# Patient Record
Sex: Female | Born: 1955 | Race: White | Hispanic: No | Smoking: Current every day smoker
Health system: Southern US, Community
[De-identification: ages and names within clinical notes are randomized; demographics above are authoritative.]

## PROBLEM LIST (undated history)

## (undated) DIAGNOSIS — E119 Type 2 diabetes mellitus without complications: Secondary | ICD-10-CM

## (undated) DIAGNOSIS — I509 Heart failure, unspecified: Secondary | ICD-10-CM

## (undated) DIAGNOSIS — Z95810 Presence of automatic (implantable) cardiac defibrillator: Secondary | ICD-10-CM

## (undated) DIAGNOSIS — J449 Chronic obstructive pulmonary disease, unspecified: Secondary | ICD-10-CM

## (undated) DIAGNOSIS — E039 Hypothyroidism, unspecified: Secondary | ICD-10-CM

## (undated) HISTORY — PX: PACEMAKER PLACEMENT: SHX43

## (undated) HISTORY — PX: CARDIAC DEFIBRILLATOR PLACEMENT: SHX171

---

## 2005-06-09 ENCOUNTER — Ambulatory Visit: Payer: Self-pay | Admitting: Internal Medicine

## 2018-11-21 ENCOUNTER — Emergency Department (HOSPITAL_COMMUNITY): Payer: Medicaid Other

## 2018-11-21 ENCOUNTER — Encounter (HOSPITAL_COMMUNITY): Payer: Self-pay | Admitting: Internal Medicine

## 2018-11-21 ENCOUNTER — Inpatient Hospital Stay (HOSPITAL_COMMUNITY)
Admission: EM | Admit: 2018-11-21 | Discharge: 2018-11-29 | DRG: 640 | Disposition: A | Discharge status: Home Health | Payer: Medicaid Other | Attending: Family Medicine | Admitting: Family Medicine

## 2018-11-21 DIAGNOSIS — E512 Wernicke's encephalopathy: Principal | ICD-10-CM | POA: Diagnosis present

## 2018-11-21 DIAGNOSIS — Z881 Allergy status to other antibiotic agents status: Secondary | ICD-10-CM

## 2018-11-21 DIAGNOSIS — R443 Hallucinations, unspecified: Secondary | ICD-10-CM | POA: Diagnosis present

## 2018-11-21 DIAGNOSIS — Z794 Long term (current) use of insulin: Secondary | ICD-10-CM

## 2018-11-21 DIAGNOSIS — E039 Hypothyroidism, unspecified: Secondary | ICD-10-CM | POA: Diagnosis not present

## 2018-11-21 DIAGNOSIS — R4182 Altered mental status, unspecified: Secondary | ICD-10-CM | POA: Diagnosis not present

## 2018-11-21 DIAGNOSIS — Z20828 Contact with and (suspected) exposure to other viral communicable diseases: Secondary | ICD-10-CM | POA: Diagnosis present

## 2018-11-21 DIAGNOSIS — F329 Major depressive disorder, single episode, unspecified: Secondary | ICD-10-CM | POA: Diagnosis present

## 2018-11-21 DIAGNOSIS — R0902 Hypoxemia: Secondary | ICD-10-CM | POA: Diagnosis present

## 2018-11-21 DIAGNOSIS — Z7951 Long term (current) use of inhaled steroids: Secondary | ICD-10-CM

## 2018-11-21 DIAGNOSIS — R441 Visual hallucinations: Secondary | ICD-10-CM | POA: Diagnosis not present

## 2018-11-21 DIAGNOSIS — M542 Cervicalgia: Secondary | ICD-10-CM

## 2018-11-21 DIAGNOSIS — R440 Auditory hallucinations: Secondary | ICD-10-CM | POA: Diagnosis not present

## 2018-11-21 DIAGNOSIS — E876 Hypokalemia: Secondary | ICD-10-CM | POA: Diagnosis present

## 2018-11-21 DIAGNOSIS — Z7982 Long term (current) use of aspirin: Secondary | ICD-10-CM

## 2018-11-21 DIAGNOSIS — G9340 Encephalopathy, unspecified: Secondary | ICD-10-CM | POA: Diagnosis present

## 2018-11-21 DIAGNOSIS — F1020 Alcohol dependence, uncomplicated: Secondary | ICD-10-CM | POA: Diagnosis present

## 2018-11-21 DIAGNOSIS — R509 Fever, unspecified: Secondary | ICD-10-CM | POA: Diagnosis present

## 2018-11-21 DIAGNOSIS — Z888 Allergy status to other drugs, medicaments and biological substances status: Secondary | ICD-10-CM

## 2018-11-21 DIAGNOSIS — J45909 Unspecified asthma, uncomplicated: Secondary | ICD-10-CM | POA: Diagnosis present

## 2018-11-21 DIAGNOSIS — Z79890 Hormone replacement therapy (postmenopausal): Secondary | ICD-10-CM

## 2018-11-21 DIAGNOSIS — Z79899 Other long term (current) drug therapy: Secondary | ICD-10-CM

## 2018-11-21 DIAGNOSIS — Z950 Presence of cardiac pacemaker: Secondary | ICD-10-CM

## 2018-11-21 DIAGNOSIS — R4701 Aphasia: Secondary | ICD-10-CM | POA: Diagnosis present

## 2018-11-21 DIAGNOSIS — E1165 Type 2 diabetes mellitus with hyperglycemia: Secondary | ICD-10-CM | POA: Diagnosis not present

## 2018-11-21 DIAGNOSIS — Z885 Allergy status to narcotic agent status: Secondary | ICD-10-CM

## 2018-11-21 DIAGNOSIS — Z9981 Dependence on supplemental oxygen: Secondary | ICD-10-CM

## 2018-11-21 DIAGNOSIS — I442 Atrioventricular block, complete: Secondary | ICD-10-CM | POA: Diagnosis present

## 2018-11-21 DIAGNOSIS — Z95810 Presence of automatic (implantable) cardiac defibrillator: Secondary | ICD-10-CM

## 2018-11-21 DIAGNOSIS — Z91041 Radiographic dye allergy status: Secondary | ICD-10-CM

## 2018-11-21 DIAGNOSIS — F17200 Nicotine dependence, unspecified, uncomplicated: Secondary | ICD-10-CM | POA: Diagnosis present

## 2018-11-21 DIAGNOSIS — G9341 Metabolic encephalopathy: Secondary | ICD-10-CM | POA: Diagnosis present

## 2018-11-21 HISTORY — DX: Hypothyroidism, unspecified: E03.9

## 2018-11-21 HISTORY — DX: Type 2 diabetes mellitus without complications: E11.9

## 2018-11-21 LAB — COMPREHENSIVE METABOLIC PANEL
ALT: 36 U/L (ref 0–44)
AST: 77 U/L — ABNORMAL HIGH (ref 15–41)
Albumin: 3 g/dL — ABNORMAL LOW (ref 3.5–5.0)
Alkaline Phosphatase: 142 U/L — ABNORMAL HIGH (ref 38–126)
Anion gap: 11 (ref 5–15)
BUN: 9 mg/dL (ref 8–23)
CO2: 24 mmol/L (ref 22–32)
Calcium: 8.9 mg/dL (ref 8.9–10.3)
Chloride: 105 mmol/L (ref 98–111)
Creatinine, Ser: 0.64 mg/dL (ref 0.44–1.00)
GFR calc Af Amer: >60 mL/min (ref >60)
GFR calc non Af Amer: >60 mL/min (ref >60)
Glucose, Bld: 162 mg/dL — ABNORMAL HIGH (ref 70–99)
Potassium: 4.5 mmol/L (ref 3.5–5.1)
Sodium: 140 mmol/L (ref 135–145)
Total Bilirubin: 1.1 mg/dL (ref 0.3–1.2)
Total Protein: 6.4 g/dL — ABNORMAL LOW (ref 6.5–8.1)

## 2018-11-21 LAB — RAPID URINE DRUG SCREEN, HOSP PERFORMED
Amphetamines: NOT DETECTED
Barbiturates: NOT DETECTED
Benzodiazepines: NOT DETECTED
Cocaine: NOT DETECTED
Opiates: NOT DETECTED
Tetrahydrocannabinol: NOT DETECTED

## 2018-11-21 LAB — I-STAT CHEM 8, ED
BUN: 11 mg/dL (ref 8–23)
Calcium, Ion: 1.05 mmol/L — ABNORMAL LOW (ref 1.15–1.40)
Chloride: 105 mmol/L (ref 98–111)
Creatinine, Ser: 0.5 mg/dL (ref 0.44–1.00)
Glucose, Bld: 153 mg/dL — ABNORMAL HIGH (ref 70–99)
HCT: 44 % (ref 36.0–46.0)
Hemoglobin: 15 g/dL (ref 12.0–15.0)
Potassium: 4.2 mmol/L (ref 3.5–5.1)
Sodium: 139 mmol/L (ref 135–145)
TCO2: 23 mmol/L (ref 22–32)

## 2018-11-21 LAB — HEMOGLOBIN A1C
Hgb A1c MFr Bld: 11 % — ABNORMAL HIGH (ref 4.8–5.6)
Mean Plasma Glucose: 269 mg/dL

## 2018-11-21 LAB — ETHANOL: Alcohol, Ethyl (B): <10 mg/dL (ref <10)

## 2018-11-21 LAB — ACETAMINOPHEN LEVEL: Acetaminophen (Tylenol), Serum: <10 ug/mL — ABNORMAL LOW (ref 10–30)

## 2018-11-21 LAB — CBC
HCT: 44.4 % (ref 36.0–46.0)
Hemoglobin: 14.9 g/dL (ref 12.0–15.0)
MCH: 29.5 pg (ref 26.0–34.0)
MCHC: 33.6 g/dL (ref 30.0–36.0)
MCV: 87.9 fL (ref 80.0–100.0)
Platelets: 224 10*3/uL (ref 150–400)
RBC: 5.05 MIL/uL (ref 3.87–5.11)
RDW: 13.1 % (ref 11.5–15.5)
WBC: 8.9 10*3/uL (ref 4.0–10.5)
nRBC: 0 % (ref 0.0–0.2)

## 2018-11-21 LAB — CBG MONITORING, ED
Glucose-Capillary: 160 mg/dL — ABNORMAL HIGH (ref 70–99)
Glucose-Capillary: 149 mg/dL — ABNORMAL HIGH (ref 70–99)

## 2018-11-21 LAB — AMMONIA: Ammonia: 10 umol/L (ref 9–35)

## 2018-11-21 LAB — DIFFERENTIAL
Abs Immature Granulocytes: 0.02 10*3/uL (ref 0.00–0.07)
Basophils Absolute: 0 10*3/uL (ref 0.0–0.1)
Basophils Relative: 0 %
Eosinophils Absolute: 0.1 10*3/uL (ref 0.0–0.5)
Eosinophils Relative: 1 %
Immature Granulocytes: 0 %
Lymphocytes Relative: 16 %
Lymphs Abs: 1.4 10*3/uL (ref 0.7–4.0)
Monocytes Absolute: 0.7 10*3/uL (ref 0.1–1.0)
Monocytes Relative: 8 %
Neutro Abs: 6.6 10*3/uL (ref 1.7–7.7)
Neutrophils Relative %: 75 %

## 2018-11-21 LAB — APTT: aPTT: 34 seconds (ref 24–36)

## 2018-11-21 LAB — TROPONIN I (HIGH SENSITIVITY): Troponin I (High Sensitivity): 28 ng/L — ABNORMAL HIGH (ref <18)

## 2018-11-21 LAB — TSH: TSH: <0.01 u[IU]/mL — ABNORMAL LOW (ref 0.350–4.500)

## 2018-11-21 LAB — SALICYLATE LEVEL: Salicylate Lvl: <7 mg/dL (ref 2.8–30.0)

## 2018-11-21 LAB — SARS CORONAVIRUS 2 BY RT PCR (HOSPITAL ORDER, PERFORMED IN ~~LOC~~ HOSPITAL LAB): SARS Coronavirus 2: NEGATIVE

## 2018-11-21 LAB — PROTIME-INR
INR: 1.2 (ref 0.8–1.2)
Prothrombin Time: 15.2 seconds (ref 11.4–15.2)

## 2018-11-21 MED ORDER — SODIUM CHLORIDE 0.9 % IV SOLN
INTRAVENOUS | Status: AC
  Administered 2018-11-21 – 2018-11-22 (×3): via INTRAVENOUS

## 2018-11-21 MED ORDER — ACETAMINOPHEN 650 MG RE SUPP
650.0000 mg | RECTAL | Status: DC | PRN
  Administered 2018-11-22: 01:00:00 650 mg via RECTAL
  Filled 2018-11-21: qty 1

## 2018-11-21 MED ORDER — LORAZEPAM 2 MG/ML IJ SOLN
1.0000 mg | Freq: Once | INTRAMUSCULAR | Status: AC
  Administered 2018-11-21: 21:00:00 1 mg via INTRAVENOUS
  Filled 2018-11-21: qty 1

## 2018-11-21 MED ORDER — IOHEXOL 350 MG/ML SOLN
100.0000 mL | Freq: Once | INTRAVENOUS | Status: AC | PRN
  Administered 2018-11-21: 18:00:00 100 mL via INTRAVENOUS

## 2018-11-21 MED ORDER — LEVOTHYROXINE SODIUM 100 MCG/5ML IV SOLN
137.5000 ug | Freq: Every day | INTRAVENOUS | Status: DC
  Filled 2018-11-21: qty 10

## 2018-11-21 MED ORDER — INSULIN ASPART 100 UNIT/ML ~~LOC~~ SOLN
0.0000 [IU] | SUBCUTANEOUS | Status: DC
  Administered 2018-11-22: 05:00:00 2 [IU] via SUBCUTANEOUS
  Administered 2018-11-23 (×2): 1 [IU] via SUBCUTANEOUS
  Administered 2018-11-24: 22:00:00 2 [IU] via SUBCUTANEOUS
  Administered 2018-11-24: 13:00:00 5 [IU] via SUBCUTANEOUS
  Administered 2018-11-24 (×2): 2 [IU] via SUBCUTANEOUS
  Administered 2018-11-24: 05:00:00 5 [IU] via SUBCUTANEOUS
  Administered 2018-11-25: 13:00:00 2 [IU] via SUBCUTANEOUS
  Administered 2018-11-25: 1 [IU] via SUBCUTANEOUS
  Administered 2018-11-25: 18:00:00 5 [IU] via SUBCUTANEOUS
  Administered 2018-11-26 (×2): 1 [IU] via SUBCUTANEOUS
  Administered 2018-11-26 (×3): 2 [IU] via SUBCUTANEOUS
  Administered 2018-11-26: 22:00:00 1 [IU] via SUBCUTANEOUS
  Administered 2018-11-27 (×3): 2 [IU] via SUBCUTANEOUS
  Administered 2018-11-27: 1 [IU] via SUBCUTANEOUS
  Administered 2018-11-28: 2 [IU] via SUBCUTANEOUS
  Administered 2018-11-28: 1 [IU] via SUBCUTANEOUS
  Administered 2018-11-28: 13:00:00 5 [IU] via SUBCUTANEOUS
  Administered 2018-11-28 (×2): 2 [IU] via SUBCUTANEOUS
  Administered 2018-11-28: 3 [IU] via SUBCUTANEOUS
  Administered 2018-11-29: 2 [IU] via SUBCUTANEOUS
  Administered 2018-11-29 (×2): 1 [IU] via SUBCUTANEOUS
  Administered 2018-11-29: 12:00:00 2 [IU] via SUBCUTANEOUS

## 2018-11-21 MED ORDER — ACETAMINOPHEN 160 MG/5ML PO SOLN: 650.0000 mg | ORAL | Status: DC | PRN

## 2018-11-21 MED ORDER — ACETAMINOPHEN 325 MG PO TABS: 650.0000 mg | ORAL_TABLET | ORAL | Status: DC | PRN

## 2018-11-21 MED ORDER — STROKE: EARLY STAGES OF RECOVERY BOOK
Freq: Once | Status: AC
  Administered 2018-11-22: 11:00:00
  Filled 2018-11-21 (×2): qty 1

## 2018-11-21 MED ORDER — MOMETASONE FURO-FORMOTEROL FUM 200-5 MCG/ACT IN AERO
2.0000 | INHALATION_SPRAY | Freq: Two times a day (BID) | RESPIRATORY_TRACT | Status: DC
  Administered 2018-11-22 – 2018-11-29 (×12): 2 via RESPIRATORY_TRACT
  Filled 2018-11-21 (×2): qty 8.8

## 2018-11-21 MED ORDER — ASPIRIN 325 MG PO TABS
325.0000 mg | ORAL_TABLET | Freq: Every day | ORAL | Status: DC
  Administered 2018-11-24 – 2018-11-26 (×3): 325 mg via ORAL
  Filled 2018-11-21 (×3): qty 1

## 2018-11-21 MED ORDER — ASPIRIN 300 MG RE SUPP
300.0000 mg | Freq: Every day | RECTAL | Status: DC
  Administered 2018-11-23: 300 mg via RECTAL
  Filled 2018-11-21: qty 1

## 2018-11-21 MED ORDER — SODIUM CHLORIDE 0.9% FLUSH
3.0000 mL | Freq: Once | INTRAVENOUS | Status: AC
  Administered 2018-11-21: 3 mL via INTRAVENOUS

## 2018-11-21 NOTE — H&P (Addendum)
History and Physical    Anna Rojas ZOX:[id number] DOB: [birth year] DOA: 11/21/2018  PCP: Anna Rojas, No Pcp Per  Anna Rojas coming from: Home.  History obtained from Anna Rojas's fianc.  Chief Complaint: Confusion.  HPI: Anna Rojas is a 63 y.o. female with history of torsades status post ICD placement, history of complete heart block status post pacemaker placement, diabetes mellitus, hypothyroidism was brought to the ER Anna Rojas was found to be confused by Anna Rojas's family.  As per Anna Rojas's fancy Anna Rojas last spoke to him around November 16, 2018.  Anna Rojas also had communication with family around Tuesday that is November 19, 2018.  Since Anna Rojas was not able to be reached by Anna Rojas's fianc Anna Rojas's friend to call of his brother to check on the Anna Rojas.  Anna Rojas was found to be negative on the bed confused at her home.  Was brought to the ER as a code stroke.  As per the report Anna Rojas usually drinks alcohol heavily but has not been had any alcohol for last 6 months and started drinking again 2 days ago.  Anna Rojas has had 7 beers a day ago.  ED Course: In the ER Anna Rojas was initially afebrile and CT head followed by CT perfusion study and CT angiogr creatinine 0.6 AST 77 ALT 36 am head and neck did not show anything acute.  Anna Rojas appeared to be confused.  Labs show ammonia of 10.  CBC showed WBC of 8.9 hemoglobin 14.9 platelets 224.  At this time neurologist recommended further management and observation.  MRI was unable to be done due to Anna Rojas having pacemaker annually cardiac input.  Review of Systems: As per HPI, rest all negative.   Past Medical History:  Diagnosis Date   Diabetes mellitus without complication (HCC)    Hypothyroidism     Past Surgical History:  Procedure Laterality Date   CARDIAC DEFIBRILLATOR PLACEMENT     PACEMAKER PLACEMENT       reports that she has been smoking. She has never used smokeless tobacco. No history on file for alcohol and  drug.  Allergies  Allergen Reactions   Beclomethasone Itching, Rash and Other (See Comments)    Flushing skin redness, also   Brimonidine Hives and Itching   Codeine Hives and Rash   Darvon [Propoxyphene] Hives   Iodinated Diagnostic Agents Hives   Iodine-131 Hives   Oxycodone Hives and Rash   Tranexamic Acid Itching and Rash   Ceftriaxone Sodium In Dextrose Rash    12/29/17 Developed diffuse pruritic rash on chest, face, hips, arms after finishing rocephin dose.  Anna Rojas did have rocephin in 2017 without a problem though...   Norethindrone Rash   Brimonidine Tartrate Hives and Itching   Celecoxib Itching and Rash   Oxycontin [Oxycodone Hcl] Hives and Rash    Family History  Problem Relation Age of Onset   Brain cancer Mother    Lung cancer Sister     Prior to Admission medications   Medication Sig Start Date End Date Taking? Authorizing Provider  amitriptyline (ELAVIL) 100 MG tablet Take 100 mg by mouth at bedtime.   Yes [provider]  aspirin EC 81 MG tablet Take 81 mg by mouth daily.   Yes [provider]  citalopram (CELEXA) 20 MG tablet Take 20 mg by mouth daily. 11/14/18  Yes [provider]  furosemide (LASIX) 20 MG tablet Take 40 mg by mouth daily. 10/24/18  Yes [provider]  glimepiride (AMARYL) 4 MG tablet Take 2-4 mg by mouth See admin  instructions. Take 2-4 mg by mouth two times a day- 2 mg if BGL is >100 and 4 mg if BGL is >250 10/24/18  Yes [provider]  metFORMIN (GLUCOPHAGE) 500 MG tablet Take 500 mg by mouth 2 (two) times daily with a meal.   Yes [provider]  omeprazole (PRILOSEC) 40 MG capsule Take 40 mg by mouth daily.   Yes [provider]  OXYGEN Inhale into the lungs as needed for shortness of breath.   Yes [provider]  potassium chloride SA (KLOR-CON) 20 MEQ tablet Take 20 mEq by mouth daily.   Yes [provider]  SYMBICORT 160-4.5 MCG/ACT inhaler  Inhale 2 puffs into the lungs 2 (two) times daily. 11/11/18  Yes [provider]  SYNTHROID 300 MCG tablet Take 300 mcg by mouth daily before breakfast.   Yes [provider]  TRESIBA FLEXTOUCH 100 UNIT/ML SOPN FlexTouch Pen Inject 10 Units into the skin at bedtime.  10/21/18  Yes [provider]    Physical Exam: Constitutional: Moderately built and nourished. Vitals:   11/21/18 1945 11/21/18 2030 11/21/18 2045 11/21/18 2100  BP: 137/83 (!) 146/83 (!) 146/86 (!) 146/87  Pulse: (!) 109 (!) 108 (!) 113 (!) 112  Resp: 20 20 (!) 24 19  Temp:      TempSrc:      SpO2: 98% 96% 97% 100%   Eyes: Anicteric no pallor. ENMT: No discharge from the ears eyes nose or mouth. Neck: No neck rigidity no mass felt. Respiratory: No rhonchi or crepitations. Cardiovascular: S1-S2 heard. Abdomen: Soft nontender bowel sounds present. Musculoskeletal: No edema. Skin: No rash. Neurologic: Anna Rojas is confused does not follow commands pupils are reactive light. Psychiatric: Anna Rojas is confused.   Labs on Admission: I have personally reviewed following labs and imaging studies  CBC: Recent Labs  Lab 11/21/18 1804 11/21/18 1811  WBC 8.9  --   NEUTROABS 6.6  --   HGB 14.9 15.0  HCT 44.4 44.0  MCV 87.9  --   PLT 224  --    Basic Metabolic Panel: Recent Labs  Lab 11/21/18 1804 11/21/18 1811  NA 140 139  K 4.5 4.2  CL 105 105  CO2 24  --   GLUCOSE 162* 153*  BUN 9 11  CREATININE 0.64 0.50  CALCIUM 8.9  --    GFR: CrCl cannot be calculated (Unknown ideal weight.). Liver Function Tests: Recent Labs  Lab 11/21/18 1804  AST 77*  ALT 36  ALKPHOS 142*  BILITOT 1.1  PROT 6.4*  ALBUMIN 3.0*   No results for input(s): LIPASE, AMYLASE in the last 168 hours. Recent Labs  Lab 11/21/18 1920  AMMONIA 10   Coagulation Profile: Recent Labs  Lab 11/21/18 1804  INR 1.2   Cardiac Enzymes: No results for input(s): CKTOTAL, CKMB, CKMBINDEX, TROPONINI in the last  168 hours. BNP (last 3 results) No results for input(s): PROBNP in the last 8760 hours. HbA1C: No results for input(s): HGBA1C in the last 72 hours. CBG: Recent Labs  Lab 11/21/18 1803  GLUCAP 160*   Lipid Profile: No results for input(s): CHOL, HDL, LDLCALC, TRIG, CHOLHDL, LDLDIRECT in the last 72 hours. Thyroid Function Tests: Recent Labs    11/21/18 1920  TSH <0.010*   Anemia Panel: No results for input(s): VITAMINB12, FOLATE, FERRITIN, TIBC, IRON, RETICCTPCT in the last 72 hours. Urine analysis: No results found for: COLORURINE, APPEARANCEUR, LABSPEC, PHURINE, GLUCOSEU, HGBUR, BILIRUBINUR, KETONESUR, PROTEINUR, UROBILINOGEN, NITRITE, LEUKOCYTESUR Sepsis Labs: (procalcitonin:4,lacticidven:4) )No  results found for this or any previous visit (from the past 240 hour(s)).   Radiological Exams on Admission: Ct Angio Head W Or Wo Contrast  Result Date: 11/21/2018 CLINICAL DATA:  Focal neuro deficit greater than 6 hours. Right-sided weakness and aphasia. EXAM: CT ANGIOGRAPHY HEAD AND NECK CT PERFUSION BRAIN TECHNIQUE: Multidetector CT imaging of the head and neck was performed using the standard protocol during bolus administration of intravenous contrast. Multiplanar CT image reconstructions and MIPs were obtained to evaluate the vascular anatomy. Carotid stenosis measurements (when applicable) are obtained utilizing NASCET criteria, using the distal internal carotid diameter as the denominator. Multiphase CT imaging of the brain was performed following IV bolus contrast injection. Subsequent parametric perfusion maps were calculated using RAPID software. CONTRAST:  OMNIPAQUE IOHEXOL 350 MG/ML SOLN COMPARISON:  CT head 11/21/2018 FINDINGS: CTA NECK FINDINGS Aortic arch: Standard branching. Imaged portion shows no evidence of aneurysm or dissection. No significant stenosis of the major arch vessel origins. Right carotid system: No significant right carotid stenosis. Small  calcific plaque at the origin of the right external carotid artery. Left carotid system: Mild atherosclerotic disease left carotid bifurcation. No significant left carotid stenosis. Vertebral arteries: Both vertebral arteries patent to the basilar without significant stenosis. Skeleton: Mild degenerative changes in the cervical spine. No acute skeletal abnormality. Other neck: Negative for mass or adenopathy. Upper chest: Left-sided pacemaker. Lung apices clear bilaterally. Review of the MIP images confirms the above findings CTA HEAD FINDINGS Anterior circulation: Cavernous carotid widely patent bilaterally without significant calcification or stenosis. Anterior and middle cerebral arteries widely patent bilaterally. Posterior circulation: Both vertebral arteries patent to the basilar. PICA patent bilaterally. Basilar widely patent. Superior cerebellar and posterior cerebral arteries patent bilaterally. Venous sinuses: Minimal venous contrast due to timing of the scan Anatomic variants: None Review of the MIP images confirms the above findings CT Brain Perfusion Findings: ASPECTS: 10 CBF (<30%) Volume: 0mL Perfusion (Tmax>6.0s) volume: 0mL Mismatch Volume: 0mL Infarction Location:None IMPRESSION: 1. CT perfusion negative for acute ischemia or infarct.  Aspect 10 2. No significant carotid or vertebral artery stenosis in the neck. 3. No significant cranial stenosis or large vessel occlusion. Electronically Signed   By: Marlan Palau M.D.   On: 11/21/2018 18:54   Ct Angio Neck W Or Wo Contrast  Result Date: 11/21/2018 CLINICAL DATA:  Focal neuro deficit greater than 6 hours. Right-sided weakness and aphasia. EXAM: CT ANGIOGRAPHY HEAD AND NECK CT PERFUSION BRAIN TECHNIQUE: Multidetector CT imaging of the head and neck was performed using the standard protocol during bolus administration of intravenous contrast. Multiplanar CT image reconstructions and MIPs were obtained to evaluate the vascular anatomy. Carotid  stenosis measurements (when applicable) are obtained utilizing NASCET criteria, using the distal internal carotid diameter as the denominator. Multiphase CT imaging of the brain was performed following IV bolus contrast injection. Subsequent parametric perfusion maps were calculated using RAPID software. CONTRAST:  OMNIPAQUE IOHEXOL 350 MG/ML SOLN COMPARISON:  CT head 11/21/2018 FINDINGS: CTA NECK FINDINGS Aortic arch: Standard branching. Imaged portion shows no evidence of aneurysm or dissection. No significant stenosis of the major arch vessel origins. Right carotid system: No significant right carotid stenosis. Small calcific plaque at the origin of the right external carotid artery. Left carotid system: Mild atherosclerotic disease left carotid bifurcation. No significant left carotid stenosis. Vertebral arteries: Both vertebral arteries patent to the basilar without significant stenosis. Skeleton: Mild degenerative changes in the cervical spine. No acute skeletal abnormality. Other neck: Negative for mass or  adenopathy. Upper chest: Left-sided pacemaker. Lung apices clear bilaterally. Review of the MIP images confirms the above findings CTA HEAD FINDINGS Anterior circulation: Cavernous carotid widely patent bilaterally without significant calcification or stenosis. Anterior and middle cerebral arteries widely patent bilaterally. Posterior circulation: Both vertebral arteries patent to the basilar. PICA patent bilaterally. Basilar widely patent. Superior cerebellar and posterior cerebral arteries patent bilaterally. Venous sinuses: Minimal venous contrast due to timing of the scan Anatomic variants: None Review of the MIP images confirms the above findings CT Brain Perfusion Findings: ASPECTS: 10 CBF (<30%) Volume: 50mL Perfusion (Tmax>6.0s) volume: 82mL Mismatch Volume: 21mL Infarction Location:None IMPRESSION: 1. CT perfusion negative for acute ischemia or infarct.  Aspect 10 2. No significant carotid or  vertebral artery stenosis in the neck. 3. No significant cranial stenosis or large vessel occlusion. Electronically Signed   By: Franchot Gallo M.D.   On: 11/21/2018 18:54   Ct C-spine No Charge  Result Date: 11/21/2018 CLINICAL DATA:  63 year old female code stroke presentation today. Right side weakness. EXAM: CT CERVICAL SPINE WITH CONTRAST TECHNIQUE: Multiplanar CT images of the cervical spine were reconstructed from contemporary CTA of the Neck. CONTRAST:  No additional COMPARISON:  CT head, CTA head and neck, and CT perfusion earlier today. FINDINGS: Alignment: Straightening of cervical lordosis. Cervicothoracic junction alignment is within normal limits. Bilateral posterior element alignment is within normal limits. Skull base and vertebrae: Mild motion degradation at C2. Visualized skull base is intact. No atlanto-occipital dissociation. No acute osseous abnormality identified. Soft tissues and spinal canal: No prevertebral fluid or swelling. No visible canal hematoma. Neck soft tissues reported with the CTA today separately. Disc levels: Widespread cervical spine degeneration. There is ankylosis of the C2-C3 posterior elements on the right. There is advanced disc and endplate degeneration D1-V6 through C6-C7. 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 Subsequently, there is mild and up to moderate multilevel cervical spinal stenosis, likely maximal at C5-C6. Upper chest: Upper thoracic levels appear intact. Other chest findings reported on the CTA today separately. IMPRESSION: 1. No acute osseous abnormality in the cervical spine. 2. 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 Degenerative cervical spinal stenosis from C4-C5 to C6-C7, up to moderate at C5-C6. 3. CTA head and neck today reported separately. Electronically Signed   By: Genevie Ann M.D.   On: 11/21/2018 21:00   Ct Cerebral Perfusion W Contrast  Result Date:  11/21/2018 CLINICAL DATA:  Focal neuro deficit greater than 6 hours. Right-sided weakness and aphasia. EXAM: CT ANGIOGRAPHY HEAD AND NECK CT PERFUSION BRAIN TECHNIQUE: Multidetector CT imaging of the head and neck was performed using the standard protocol during bolus administration of intravenous contrast. Multiplanar CT image reconstructions and MIPs were obtained to evaluate the vascular anatomy. Carotid stenosis measurements (when applicable) are obtained utilizing NASCET criteria, using the distal internal carotid diameter as the denominator. Multiphase CT imaging of the brain was performed following IV bolus contrast injection. Subsequent parametric perfusion maps were calculated using RAPID software. CONTRAST:  177mL OMNIPAQUE IOHEXOL 350 MG/ML SOLN COMPARISON:  CT head 11/21/2018 FINDINGS: CTA NECK FINDINGS Aortic arch: Standard branching. Imaged portion shows no evidence of aneurysm or dissection. No significant stenosis of the major arch vessel origins. Right carotid system: No significant right carotid stenosis. Small calcific plaque at the origin of the right external carotid artery. Left carotid system: Mild atherosclerotic disease left carotid bifurcation. No significant left carotid stenosis. Vertebral arteries: Both vertebral arteries patent to the basilar without significant stenosis.  Skeleton: Mild degenerative changes in the cervical spine. No acute skeletal abnormality. Other neck: Negative for mass or adenopathy. Upper chest: Left-sided pacemaker. Lung apices clear bilaterally. Review of the MIP images confirms the above findings CTA HEAD FINDINGS Anterior circulation: Cavernous carotid widely patent bilaterally without significant calcification or stenosis. Anterior and middle cerebral arteries widely patent bilaterally. Posterior circulation: Both vertebral arteries patent to the basilar. PICA patent bilaterally. Basilar widely patent. Superior cerebellar and posterior cerebral arteries patent  bilaterally. Venous sinuses: Minimal venous contrast due to timing of the scan Anatomic variants: None Review of the MIP images confirms the above findings CT Brain Perfusion Findings: ASPECTS: 10 CBF (<30%) Volume: 32mL Perfusion (Tmax>6.0s) volume: 73mL Mismatch Volume: 76mL Infarction Location:None IMPRESSION: 1. CT perfusion negative for acute ischemia or infarct.  Aspect 10 2. No significant carotid or vertebral artery stenosis in the neck. 3. No significant cranial stenosis or large vessel occlusion. Electronically Signed   By: Marlan Palau M.D.   On: 11/21/2018 18:54   Ct Head Code Stroke Wo Contrast  Result Date: 11/21/2018 CLINICAL DATA:  Code stroke. Focal neuro deficit greater than 6 hours. Rule out stroke. EXAM: CT HEAD WITHOUT CONTRAST TECHNIQUE: Contiguous axial images were obtained from the base of the skull through the vertex without intravenous contrast. COMPARISON:  None. FINDINGS: Brain: Mild atrophy. Negative for acute infarct, hemorrhage, mass. Mild white matter changes appear chronic. Vascular: Negative for hyperdense vessel Skull: Negative Sinuses/Orbits: Negative Other: None ASPECTS ([place] Stroke Program Early CT Score) - Ganglionic level infarction (caudate, lentiform nuclei, internal capsule, insula, M1-M3 cortex): 7 - Supraganglionic infarction (M4-M6 cortex): 3 Total score (0-10 with 10 being normal): 10 IMPRESSION: 1. No acute abnormality 2. ASPECTS is 10 3. These results were called by telephone at the time of interpretation on 11/21/2018 at 6:35 pm to provider Amada Jupiter MD , who verbally acknowledged these results. Electronically Signed   By: Marlan Palau M.D.   On: 11/21/2018 18:37    EKG: Independently reviewed.  Sinus rhythm IVCD.  Assessment/Plan Principal Problem:   Acute encephalopathy Active Problems:   Uncontrolled type 2 diabetes mellitus with hyperglycemia (HCC)   Hypothyroidism   Acute metabolic encephalopathy    1. Acute encephalopathy -cause not  clear.  Appreciate neurology consult.  Unable to do MRI at this time since Anna Rojas has defibrillator/pacemaker.  Will need cardiac input before doing MRI.  Ammonia levels are negative Anna Rojas has been so far afebrile.  Will check EEG.  Continue to monitor. 2. Diabetes mellitus type 2 we will keep Anna Rojas on sliding scale coverage. 3. Hypothyroidism since Anna Rojas is quite confused we will keep Anna Rojas IV Synthroid. 4. History of torsades status post ICD placement and history of complete heart block status post pacemaker placement.  Addendum -shortly after admission Anna Rojas started spiking fever 102 F.  I ordered blood cultures UA urine cultures chest x-ray.  So far UA and chest x-ray was not showing anything acute.  Anna Rojas's COVID-19 test was negative.  Will order influenza PCR and to the Anna Rojas is still encephalopathy I have placed Anna Rojas on empiric antibiotics for meningitis/encephalitis.  I have ordered acyclovir vancomycin.  Anna Rojas developed a rash with ceftriaxone changed to his Actemra for the rash Benadryl and Pepcid was given.  Steroid was not given since Anna Rojas has allergy to steroid.  Anna Rojas will need lumbar puncture.   DVT prophylaxis: SCDs. Code Status: Full code. Family Communication: Anna Rojas's fancy. Disposition Plan: To be determined. Consults called: Neurology. Admission status: Inpatient.   Georgetta Haber  Demetra ShinerN Thelton Graca MD [facility] Pager [phone].  If 7PM-7AM, please contact night-coverage [email or web address] Password [email or web address]  11/21/2018, 10:22 PM

## 2018-11-21 NOTE — ED Notes (Signed)
Call husband- Herbie [place] at [phone] ONLY if something emergent happens.

## 2018-11-21 NOTE — ED Notes (Signed)
Oral care preformed

## 2018-11-21 NOTE — ED Triage Notes (Signed)
Pt arrived via ems; Code Stroke initiated; last known well is unclear. Reported ex-husband called emergency services d/t altered mental status and garbled speech.

## 2018-11-21 NOTE — ED Provider Notes (Signed)
[facility] EMERGENCY DEPARTMENT Provider Note   CSN: [id number] Arrival date & time: 11/21/18  1800  An emergency department physician performed an initial assessment on this suspected stroke patient at 1802.  History   Chief Complaint Chief Complaint  Patient presents with   Code Stroke     HPI Anna Rojas is a 63 y.o. female.     63 year old female with prior medical history as detailed below presents for evaluation of alteration in mental status.  Patient was reportedly normal on Tuesday.  Yesterday she reportedly had more than her normal amount of alcohol.  This morning she was found in her bed naked and confused.  Timeframe of onset her symptoms is unclear.  Patient was seen upon arrival to the ED as a code stroke.  Neuro team has deemed her to be not a candidate for TPA or other acute intervention.  Patient is confused with very difficult to understand speech.  Her exam is challenging.  Level 5 caveat secondary to her altered mental status.   The history is provided by the patient, medical records and the EMS personnel.  Altered Mental Status Presenting symptoms: confusion and disorientation   Severity:  Severe Most recent episode:  Yesterday Episode history:  Continuous Timing:  Constant Progression:  Unchanged Chronicity:  New Context: alcohol use     No past medical history on file.  There are no active problems to display for this patient.    OB History   No obstetric history on file.      Home Medications    Prior to Admission medications   Not on File    Family History No family history on file.  Social History Social History   Tobacco Use   Smoking status: Not on file  Substance Use Topics   Alcohol use: Not on file   Drug use: Not on file     Allergies   Patient has no allergy information on record.   Review of Systems Review of Systems  Unable to perform ROS: Acuity of condition    Psychiatric/Behavioral: Positive for confusion.     Physical Exam Updated Vital Signs BP (!) 146/83    Pulse (!) 108    Temp 98.3 F (36.8 C) (Oral)    Resp 20    SpO2 96%   Physical Exam Vitals signs and nursing note reviewed.  Constitutional:      General: She is not in acute distress.    Appearance: She is well-developed.  HENT:     Head: Normocephalic and atraumatic.  Eyes:     Conjunctiva/sclera: Conjunctivae normal.     Pupils: Pupils are equal, round, and reactive to light.  Neck:     Musculoskeletal: Normal range of motion and neck supple.  Cardiovascular:     Rate and Rhythm: Normal rate and regular rhythm.     Heart sounds: Normal heart sounds.  Pulmonary:     Effort: Pulmonary effort is normal. No respiratory distress.     Breath sounds: Normal breath sounds.  Abdominal:     General: There is no distension.     Palpations: Abdomen is soft.     Tenderness: There is no abdominal tenderness.  Musculoskeletal: Normal range of motion.        General: No deformity.  Skin:    General: Skin is warm and dry.  Neurological:     Mental Status: She is alert.     Comments: Confused Speech is difficult to understand  Patient appears to follow commands intermittently  All 4 extremities are moving voluntarily      ED Treatments / Results  Labs (all labs ordered are listed, but only abnormal results are displayed) Labs Reviewed  COMPREHENSIVE METABOLIC PANEL - Abnormal; Notable for the following components:      Result Value   Glucose, Bld 162 (*)    Total Protein 6.4 (*)    Albumin 3.0 (*)    AST 77 (*)    Alkaline Phosphatase 142 (*)    All other components within normal limits  TSH - Abnormal; Notable for the following components:   TSH <0.010 (*)    All other components within normal limits  ACETAMINOPHEN LEVEL - Abnormal; Notable for the following components:   Acetaminophen (Tylenol), Serum <10 (*)    All other components within normal limits  CBG  MONITORING, ED - Abnormal; Notable for the following components:   Glucose-Capillary 160 (*)    All other components within normal limits  I-STAT CHEM 8, ED - Abnormal; Notable for the following components:   Glucose, Bld 153 (*)    Calcium, Ion 1.05 (*)    All other components within normal limits  PROTIME-INR  APTT  CBC  DIFFERENTIAL  RAPID URINE DRUG SCREEN, HOSP PERFORMED  AMMONIA  ETHANOL  SALICYLATE LEVEL  CBG MONITORING, ED  TROPONIN I (HIGH SENSITIVITY)    EKG EKG Interpretation  Date/Time:  Thursday November 21 2018 18:49:13 EDT Ventricular Rate:  104 PR Interval:    QRS Duration: 188 QT Interval:  399 QTC Calculation: 525 R Axis:   -77 Text Interpretation:  Sinus tachycardia Poor baseline  Confirmed by Kristine Royal [phone]) on 11/21/2018 6:51:21 PM   Radiology Ct Angio Head W Or Wo Contrast  Result Date: 11/21/2018 CLINICAL DATA:  Focal neuro deficit greater than 6 hours. Right-sided weakness and aphasia. EXAM: CT ANGIOGRAPHY HEAD AND NECK CT PERFUSION BRAIN TECHNIQUE: Multidetector CT imaging of the head and neck was performed using the standard protocol during bolus administration of intravenous contrast. Multiplanar CT image reconstructions and MIPs were obtained to evaluate the vascular anatomy. Carotid stenosis measurements (when applicable) are obtained utilizing NASCET criteria, using the distal internal carotid diameter as the denominator. Multiphase CT imaging of the brain was performed following IV bolus contrast injection. Subsequent parametric perfusion maps were calculated using RAPID software. CONTRAST:  OMNIPAQUE IOHEXOL 350 MG/ML SOLN COMPARISON:  CT head 11/21/2018 FINDINGS: CTA NECK FINDINGS Aortic arch: Standard branching. Imaged portion shows no evidence of aneurysm or dissection. No significant stenosis of the major arch vessel origins. Right carotid system: No significant right carotid stenosis. Small calcific plaque at the origin of the right  external carotid artery. Left carotid system: Mild atherosclerotic disease left carotid bifurcation. No significant left carotid stenosis. Vertebral arteries: Both vertebral arteries patent to the basilar without significant stenosis. Skeleton: Mild degenerative changes in the cervical spine. No acute skeletal abnormality. Other neck: Negative for mass or adenopathy. Upper chest: Left-sided pacemaker. Lung apices clear bilaterally. Review of the MIP images confirms the above findings CTA HEAD FINDINGS Anterior circulation: Cavernous carotid widely patent bilaterally without significant calcification or stenosis. Anterior and middle cerebral arteries widely patent bilaterally. Posterior circulation: Both vertebral arteries patent to the basilar. PICA patent bilaterally. Basilar widely patent. Superior cerebellar and posterior cerebral arteries patent bilaterally. Venous sinuses: Minimal venous contrast due to timing of the scan Anatomic variants: None Review of the MIP images confirms the above findings CT Brain Perfusion Findings: ASPECTS:  10 CBF (<30%) Volume: 0mL Perfusion (Tmax>6.0s) volume: 0mL Mismatch Volume: 0mL Infarction Location:None IMPRESSION: 1. CT perfusion negative for acute ischemia or infarct.  Aspect 10 2. No significant carotid or vertebral artery stenosis in the neck. 3. No significant cranial stenosis or large vessel occlusion. Electronically Signed   By: Marlan Palau M.D.   On: 11/21/2018 18:54   Ct Angio Neck W Or Wo Contrast  Result Date: 11/21/2018 CLINICAL DATA:  Focal neuro deficit greater than 6 hours. Right-sided weakness and aphasia. EXAM: CT ANGIOGRAPHY HEAD AND NECK CT PERFUSION BRAIN TECHNIQUE: Multidetector CT imaging of the head and neck was performed using the standard protocol during bolus administration of intravenous contrast. Multiplanar CT image reconstructions and MIPs were obtained to evaluate the vascular anatomy. Carotid stenosis measurements (when applicable) are  obtained utilizing NASCET criteria, using the distal internal carotid diameter as the denominator. Multiphase CT imaging of the brain was performed following IV bolus contrast injection. Subsequent parametric perfusion maps were calculated using RAPID software. CONTRAST:  OMNIPAQUE IOHEXOL 350 MG/ML SOLN COMPARISON:  CT head 11/21/2018 FINDINGS: CTA NECK FINDINGS Aortic arch: Standard branching. Imaged portion shows no evidence of aneurysm or dissection. No significant stenosis of the major arch vessel origins. Right carotid system: No significant right carotid stenosis. Small calcific plaque at the origin of the right external carotid artery. Left carotid system: Mild atherosclerotic disease left carotid bifurcation. No significant left carotid stenosis. Vertebral arteries: Both vertebral arteries patent to the basilar without significant stenosis. Skeleton: Mild degenerative changes in the cervical spine. No acute skeletal abnormality. Other neck: Negative for mass or adenopathy. Upper chest: Left-sided pacemaker. Lung apices clear bilaterally. Review of the MIP images confirms the above findings CTA HEAD FINDINGS Anterior circulation: Cavernous carotid widely patent bilaterally without significant calcification or stenosis. Anterior and middle cerebral arteries widely patent bilaterally. Posterior circulation: Both vertebral arteries patent to the basilar. PICA patent bilaterally. Basilar widely patent. Superior cerebellar and posterior cerebral arteries patent bilaterally. Venous sinuses: Minimal venous contrast due to timing of the scan Anatomic variants: None Review of the MIP images confirms the above findings CT Brain Perfusion Findings: ASPECTS: 10 CBF (<30%) Volume: 0mL Perfusion (Tmax>6.0s) volume: 0mL Mismatch Volume: 0mL Infarction Location:None IMPRESSION: 1. CT perfusion negative for acute ischemia or infarct.  Aspect 10 2. No significant carotid or vertebral artery stenosis in the neck. 3. No  significant cranial stenosis or large vessel occlusion. Electronically Signed   By: Marlan Palau M.D.   On: 11/21/2018 18:54   Ct C-spine No Charge  Result Date: 11/21/2018 CLINICAL DATA:  63 year old female code stroke presentation today. Right side weakness. EXAM: CT CERVICAL SPINE WITH CONTRAST TECHNIQUE: Multiplanar CT images of the cervical spine were reconstructed from contemporary CTA of the Neck. CONTRAST:  No additional COMPARISON:  CT head, CTA head and neck, and CT perfusion earlier today. FINDINGS: Alignment: Straightening of cervical lordosis. Cervicothoracic junction alignment is within normal limits. Bilateral posterior element alignment is within normal limits. Skull base and vertebrae: Mild motion degradation at C2. Visualized skull base is intact. No atlanto-occipital dissociation. No acute osseous abnormality identified. Soft tissues and spinal canal: No prevertebral fluid or swelling. No visible canal hematoma. Neck soft tissues reported with the CTA today separately. Disc levels: Widespread cervical spine degeneration. There is ankylosis of the C2-C3 posterior elements on the right. There is advanced disc and endplate degeneration C4-C5 through C6-C7. 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 Subsequently, there is mild and up to moderate multilevel  cervical spinal stenosis, likely maximal at C5-C6. Upper chest: Upper thoracic levels appear intact. Other chest findings reported on the CTA today separately. IMPRESSION: 1. No acute osseous abnormality in the cervical spine. 2. 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 Degenerative cervical spinal stenosis from C4-C5 to C6-C7, up to moderate at C5-C6. 3. CTA head and neck today reported separately. Electronically Signed   By: Genevie Ann M.D.   On: 11/21/2018 21:00   Ct Cerebral Perfusion W Contrast  Result Date: 11/21/2018 CLINICAL DATA:  Focal neuro deficit greater  than 6 hours. Right-sided weakness and aphasia. EXAM: CT ANGIOGRAPHY HEAD AND NECK CT PERFUSION BRAIN TECHNIQUE: Multidetector CT imaging of the head and neck was performed using the standard protocol during bolus administration of intravenous contrast. Multiplanar CT image reconstructions and MIPs were obtained to evaluate the vascular anatomy. Carotid stenosis measurements (when applicable) are obtained utilizing NASCET criteria, using the distal internal carotid diameter as the denominator. Multiphase CT imaging of the brain was performed following IV bolus contrast injection. Subsequent parametric perfusion maps were calculated using RAPID software. CONTRAST:  150mL OMNIPAQUE IOHEXOL 350 MG/ML SOLN COMPARISON:  CT head 11/21/2018 FINDINGS: CTA NECK FINDINGS Aortic arch: Standard branching. Imaged portion shows no evidence of aneurysm or dissection. No significant stenosis of the major arch vessel origins. Right carotid system: No significant right carotid stenosis. Small calcific plaque at the origin of the right external carotid artery. Left carotid system: Mild atherosclerotic disease left carotid bifurcation. No significant left carotid stenosis. Vertebral arteries: Both vertebral arteries patent to the basilar without significant stenosis. Skeleton: Mild degenerative changes in the cervical spine. No acute skeletal abnormality. Other neck: Negative for mass or adenopathy. Upper chest: Left-sided pacemaker. Lung apices clear bilaterally. Review of the MIP images confirms the above findings CTA HEAD FINDINGS Anterior circulation: Cavernous carotid widely patent bilaterally without significant calcification or stenosis. Anterior and middle cerebral arteries widely patent bilaterally. Posterior circulation: Both vertebral arteries patent to the basilar. PICA patent bilaterally. Basilar widely patent. Superior cerebellar and posterior cerebral arteries patent bilaterally. Venous sinuses: Minimal venous contrast  due to timing of the scan Anatomic variants: None Review of the MIP images confirms the above findings CT Brain Perfusion Findings: ASPECTS: 10 CBF (<30%) Volume: 58mL Perfusion (Tmax>6.0s) volume: 43mL Mismatch Volume: 67mL Infarction Location:None IMPRESSION: 1. CT perfusion negative for acute ischemia or infarct.  Aspect 10 2. No significant carotid or vertebral artery stenosis in the neck. 3. No significant cranial stenosis or large vessel occlusion. Electronically Signed   By: Franchot Gallo M.D.   On: 11/21/2018 18:54   Ct Head Code Stroke Wo Contrast  Result Date: 11/21/2018 CLINICAL DATA:  Code stroke. Focal neuro deficit greater than 6 hours. Rule out stroke. EXAM: CT HEAD WITHOUT CONTRAST TECHNIQUE: Contiguous axial images were obtained from the base of the skull through the vertex without intravenous contrast. COMPARISON:  None. FINDINGS: Brain: Mild atrophy. Negative for acute infarct, hemorrhage, mass. Mild white matter changes appear chronic. Vascular: Negative for hyperdense vessel Skull: Negative Sinuses/Orbits: Negative Other: None ASPECTS ([place] Stroke Program Early CT Score) - Ganglionic level infarction (caudate, lentiform nuclei, internal capsule, insula, M1-M3 cortex): 7 - Supraganglionic infarction (M4-M6 cortex): 3 Total score (0-10 with 10 being normal): 10 IMPRESSION: 1. No acute abnormality 2. ASPECTS is 10 3. These results were called by telephone at the time of interpretation on 11/21/2018 at 6:35 pm to provider Leonel Ramsay MD , who verbally acknowledged these results. Electronically Signed  By: Marlan Palauharles  Clark M.D.   On: 11/21/2018 18:37    Procedures Procedures (including critical care time) CRITICAL CARE Performed by: Wynetta FinesPeter C Tauni Sanks   Total critical care time: 30 minutes  Critical care time was exclusive of separately billable procedures and treating other patients.  Critical care was necessary to treat or prevent imminent or life-threatening deterioration.  Critical  care was time spent personally by me on the following activities: development of treatment plan with patient and/or surrogate as well as nursing, discussions with consultants, evaluation of patient's response to treatment, examination of patient, obtaining history from patient or surrogate, ordering and performing treatments and interventions, ordering and review of laboratory studies, ordering and review of radiographic studies, pulse oximetry and re-evaluation of patient's condition.   Medications Ordered in ED Medications  sodium chloride flush (NS) 0.9 % injection 3 mL (has no administration in time range)  LORazepam (ATIVAN) injection 1 mg (has no administration in time range)  iohexol (OMNIPAQUE) 350 MG/ML injection 100 mL (100 mLs Intravenous Contrast Given 11/21/18 1828)     Initial Impression / Assessment and Plan / ED Course  I have reviewed the triage vital signs and the nursing notes.  Pertinent labs & imaging results that were available during my care of the patient were reviewed by me and considered in my medical decision making (see chart for details).        MDM  Screen complete  Beatris ShipMelissa Rojas was evaluated in Emergency Department on 11/21/2018 for the symptoms described in the history of present illness. She was evaluated in the context of the global COVID-19 pandemic, which necessitated consideration that the patient might be at risk for infection with the SARS-CoV-2 virus that causes COVID-19. Institutional protocols and algorithms that pertain to the evaluation of patients at risk for COVID-19 are in a state of rapid change based on information released by regulatory bodies including the CDC and federal and state organizations. These policies and algorithms were followed during the patient's care in the ED.  Patient is presenting for evaluation of her altered mental status.  Timeframe of onset is unclear.  Patient does not meet criteria for TPA or other acute intervention  per neuro team.  Patient will require admission for further work-up and evaluation.  Hospitalist service is aware of case and will evaluate for admission.   Final Clinical Impressions(s) / ED Diagnoses   Final diagnoses:  Altered mental status, unspecified altered mental status type    ED Discharge Orders    None       Wynetta FinesMessick, Evangeline Utley C, MD 11/21/18 2124

## 2018-11-21 NOTE — Consult Note (Signed)
Neurology Consultation Reason for Consult: Aphasia Referring Physician: Demaris Callander  CC: Aphasia  History is obtained from: Family  HPI: Anna Rojas is a 63 y.o. female who was last definitely in her normal state of health Tuesday evening.  Yesterday it was noted that she had 6 or 7 beers after a long period of sobriety.  Today, when her husband was unable to get a hold of her he had his brother go over and check on her.  He found her in her bed, naked, speaking gibberish.  EMS was called and the patient was brought to the emergency room.   LKW: unclear tpa given?: no, out of window    ROS:  Unable to obtain due to altered mental status.   PMH: DM, otherwise unable to obtain due to ams.    FHx: Unable to assess secondary to patient's altered mental status.     Social History: ETOH history, non until yesterday  Exam: Current vital signs: Vitals:   11/21/18 1854 11/21/18 1900  BP: (!) 156/89 117/74  Pulse: (!) 103 (!) 110  Resp: 20 (!) 24  Temp:    SpO2: 99% 98%     Vital signs in last 24 hours:     Physical Exam  Constitutional: Appears well-developed and well-nourished.  Psych: Affect appropriate to situation Eyes: No scleral injection HENT: No OP obstrucion Head: Normocephalic.  Cardiovascular: Normal rate and regular rhythm.  Respiratory: Effort normal, non-labored breathing GI: Soft.  No distension. There is no tenderness.  Skin: WDI  Neuro: Mental Status: Patient is awake, alert, she appears at least partially aphasic, though she is able to follow commands.  Her speech is not understandable, could be severe dysarthria versus aphasia. Cranial Nerves: II: Visual Fields are full. Pupils are equal, round, and reactive to light.   III,IV, VI: EOMI without ptosis or diploplia.  V: Facial sensation is symmetric to temperature VII: Facial movement is difficult to judge due to c-collar,?  Mild right facial weakness VIII: hearing is intact to voice X: Uvula  elevates symmetrically XI: Shoulder shrug is symmetric. XII: tongue is midline without atrophy or fasciculations.  Motor: Her exam is difficult, I do question a mild right arm drift, but she is able to give 4/5 strength in all 4 extremities Sensory: She endorses symmetric sensation Deep Tendon Reflexes: 2+ and symmetric in the biceps and patellae.  Cerebellar: She appears at least mildly ataxic bilaterally   I have reviewed labs in epic and the results pertinent to this consultation are: cbg 160  I have reviewed the images obtained: CT/CTA/CTP-negative  Impression: 63 year old female with what appears to be acute aphasia.  Her exam is difficult, and I wonder if alcohol is playing a role.  She is not an IV TPA candidate due to being outside the window, there is no LVO so she is not an interventional candidate.  It is possible that she has had a stroke and an MRI I think would be prudent.  Recommendations: 1) MRI brain-stroke work-up if positive 2) EtOH level 3) TSH, ammonia 4) neurology will follow   Roland Rack, MD [facility] [phone]  If 7pm- 7am, please page neurology on call as listed in [place].

## 2018-11-21 NOTE — ED Notes (Signed)
Anna Rojas patient's fiance was reached @ [phone]. This family member reported that he believe's patients last well known is Monday, he stated he has not seen her due to work schedule and, know that she might have consume some alcohol on Tuesday. He stated that a neighbor has checked in on her and "assumed she's just drunk and sleeping it off". This family member stated he sent another family member "an ex brother-in-law" to do a wellness check and found the patient with garbled speech and confused. Mr. Anna Rojas reported a similar incident occurred in the past for this patient d/t alcohol intake. Mr. Anna Rojas stated he can be contacted at any time in regards to care for this patient.

## 2018-11-22 ENCOUNTER — Inpatient Hospital Stay (HOSPITAL_COMMUNITY): Payer: Medicaid Other

## 2018-11-22 ENCOUNTER — Observation Stay (HOSPITAL_COMMUNITY): Payer: Medicaid Other

## 2018-11-22 DIAGNOSIS — Z7982 Long term (current) use of aspirin: Secondary | ICD-10-CM | POA: Diagnosis not present

## 2018-11-22 DIAGNOSIS — E039 Hypothyroidism, unspecified: Secondary | ICD-10-CM | POA: Diagnosis present

## 2018-11-22 DIAGNOSIS — E1165 Type 2 diabetes mellitus with hyperglycemia: Secondary | ICD-10-CM | POA: Diagnosis present

## 2018-11-22 DIAGNOSIS — F1020 Alcohol dependence, uncomplicated: Secondary | ICD-10-CM | POA: Diagnosis present

## 2018-11-22 DIAGNOSIS — G9341 Metabolic encephalopathy: Secondary | ICD-10-CM | POA: Diagnosis present

## 2018-11-22 DIAGNOSIS — I442 Atrioventricular block, complete: Secondary | ICD-10-CM | POA: Diagnosis present

## 2018-11-22 DIAGNOSIS — Z885 Allergy status to narcotic agent status: Secondary | ICD-10-CM | POA: Diagnosis not present

## 2018-11-22 DIAGNOSIS — E876 Hypokalemia: Secondary | ICD-10-CM | POA: Diagnosis present

## 2018-11-22 DIAGNOSIS — R410 Disorientation, unspecified: Secondary | ICD-10-CM | POA: Diagnosis present

## 2018-11-22 DIAGNOSIS — F17200 Nicotine dependence, unspecified, uncomplicated: Secondary | ICD-10-CM | POA: Diagnosis present

## 2018-11-22 DIAGNOSIS — R0902 Hypoxemia: Secondary | ICD-10-CM | POA: Diagnosis present

## 2018-11-22 DIAGNOSIS — J45909 Unspecified asthma, uncomplicated: Secondary | ICD-10-CM | POA: Diagnosis present

## 2018-11-22 DIAGNOSIS — F329 Major depressive disorder, single episode, unspecified: Secondary | ICD-10-CM | POA: Diagnosis present

## 2018-11-22 DIAGNOSIS — Z91041 Radiographic dye allergy status: Secondary | ICD-10-CM | POA: Diagnosis not present

## 2018-11-22 DIAGNOSIS — Z95810 Presence of automatic (implantable) cardiac defibrillator: Secondary | ICD-10-CM | POA: Diagnosis not present

## 2018-11-22 DIAGNOSIS — Z79890 Hormone replacement therapy (postmenopausal): Secondary | ICD-10-CM | POA: Diagnosis not present

## 2018-11-22 DIAGNOSIS — E512 Wernicke's encephalopathy: Secondary | ICD-10-CM | POA: Diagnosis present

## 2018-11-22 DIAGNOSIS — R440 Auditory hallucinations: Secondary | ICD-10-CM | POA: Diagnosis not present

## 2018-11-22 DIAGNOSIS — Z881 Allergy status to other antibiotic agents status: Secondary | ICD-10-CM | POA: Diagnosis not present

## 2018-11-22 DIAGNOSIS — R509 Fever, unspecified: Secondary | ICD-10-CM

## 2018-11-22 DIAGNOSIS — Z888 Allergy status to other drugs, medicaments and biological substances status: Secondary | ICD-10-CM | POA: Diagnosis not present

## 2018-11-22 DIAGNOSIS — G9340 Encephalopathy, unspecified: Secondary | ICD-10-CM | POA: Diagnosis not present

## 2018-11-22 DIAGNOSIS — R441 Visual hallucinations: Secondary | ICD-10-CM | POA: Diagnosis not present

## 2018-11-22 DIAGNOSIS — Z950 Presence of cardiac pacemaker: Secondary | ICD-10-CM | POA: Diagnosis not present

## 2018-11-22 DIAGNOSIS — R4701 Aphasia: Secondary | ICD-10-CM | POA: Diagnosis present

## 2018-11-22 DIAGNOSIS — Z20828 Contact with and (suspected) exposure to other viral communicable diseases: Secondary | ICD-10-CM | POA: Diagnosis present

## 2018-11-22 LAB — COMPREHENSIVE METABOLIC PANEL
ALT: 31 U/L (ref 0–44)
AST: 56 U/L — ABNORMAL HIGH (ref 15–41)
Albumin: 2.9 g/dL — ABNORMAL LOW (ref 3.5–5.0)
Alkaline Phosphatase: 126 U/L (ref 38–126)
Anion gap: 14 (ref 5–15)
BUN: 10 mg/dL (ref 8–23)
CO2: 23 mmol/L (ref 22–32)
Calcium: 8.9 mg/dL (ref 8.9–10.3)
Chloride: 105 mmol/L (ref 98–111)
Creatinine, Ser: 0.68 mg/dL (ref 0.44–1.00)
GFR calc Af Amer: >60 mL/min (ref >60)
GFR calc non Af Amer: >60 mL/min (ref >60)
Glucose, Bld: 122 mg/dL — ABNORMAL HIGH (ref 70–99)
Potassium: 3.4 mmol/L — ABNORMAL LOW (ref 3.5–5.1)
Sodium: 142 mmol/L (ref 135–145)
Total Bilirubin: 1 mg/dL (ref 0.3–1.2)
Total Protein: 6.2 g/dL — ABNORMAL LOW (ref 6.5–8.1)

## 2018-11-22 LAB — T4, FREE: Free T4: 1.37 ng/dL — ABNORMAL HIGH (ref 0.61–1.12)

## 2018-11-22 LAB — URINALYSIS, ROUTINE W REFLEX MICROSCOPIC
Bilirubin Urine: NEGATIVE
Glucose, UA: NEGATIVE mg/dL
Ketones, ur: 20 mg/dL — AB
Leukocytes,Ua: NEGATIVE
Nitrite: NEGATIVE
Protein, ur: 30 mg/dL — AB
Specific Gravity, Urine: >1.046 — ABNORMAL HIGH (ref 1.005–1.030)
pH: 5 (ref 5.0–8.0)

## 2018-11-22 LAB — LACTIC ACID, PLASMA: Lactic Acid, Venous: 0.8 mmol/L (ref 0.5–1.9)

## 2018-11-22 LAB — GLUCOSE, CAPILLARY
Glucose-Capillary: 91 mg/dL (ref 70–99)
Glucose-Capillary: 95 mg/dL (ref 70–99)
Glucose-Capillary: 119 mg/dL — ABNORMAL HIGH (ref 70–99)

## 2018-11-22 LAB — LIPID PANEL
Cholesterol: 150 mg/dL (ref 0–200)
HDL: 39 mg/dL — ABNORMAL LOW (ref >40)
LDL Cholesterol: 85 mg/dL (ref 0–99)
Total CHOL/HDL Ratio: 3.8 RATIO
Triglycerides: 128 mg/dL (ref <150)
VLDL: 26 mg/dL (ref 0–40)

## 2018-11-22 LAB — HIV ANTIBODY (ROUTINE TESTING W REFLEX): HIV Screen 4th Generation wRfx: NONREACTIVE

## 2018-11-22 LAB — URINE CULTURE

## 2018-11-22 LAB — PROCALCITONIN: Procalcitonin: <0.1 ng/mL

## 2018-11-22 LAB — CBC
HCT: 41.3 % (ref 36.0–46.0)
Hemoglobin: 13.8 g/dL (ref 12.0–15.0)
MCH: 29.6 pg (ref 26.0–34.0)
MCHC: 33.4 g/dL (ref 30.0–36.0)
MCV: 88.6 fL (ref 80.0–100.0)
Platelets: 228 10*3/uL (ref 150–400)
RBC: 4.66 MIL/uL (ref 3.87–5.11)
RDW: 13.3 % (ref 11.5–15.5)
WBC: 7.8 10*3/uL (ref 4.0–10.5)
nRBC: 0 % (ref 0.0–0.2)

## 2018-11-22 LAB — CBG MONITORING, ED
Glucose-Capillary: 132 mg/dL — ABNORMAL HIGH (ref 70–99)
Glucose-Capillary: 154 mg/dL — ABNORMAL HIGH (ref 70–99)

## 2018-11-22 LAB — HEMOGLOBIN A1C
Hgb A1c MFr Bld: 11.1 % — ABNORMAL HIGH (ref 4.8–5.6)
Mean Plasma Glucose: 271.87 mg/dL

## 2018-11-22 LAB — TROPONIN I (HIGH SENSITIVITY): Troponin I (High Sensitivity): 27 ng/L — ABNORMAL HIGH (ref <18)

## 2018-11-22 MED ORDER — FAMOTIDINE IN NACL 20-0.9 MG/50ML-% IV SOLN
20.0000 mg | Freq: Once | INTRAVENOUS | Status: AC
  Administered 2018-11-22: 05:00:00 20 mg via INTRAVENOUS
  Filled 2018-11-22: qty 50

## 2018-11-22 MED ORDER — DEXTROSE 5 % IV SOLN
10.0000 mg/kg | Freq: Three times a day (TID) | INTRAVENOUS | Status: DC
  Administered 2018-11-22 – 2018-11-26 (×12): 815 mg via INTRAVENOUS
  Filled 2018-11-22 (×15): qty 16.3

## 2018-11-22 MED ORDER — SODIUM CHLORIDE 0.9 % IV SOLN
2.0000 g | Freq: Two times a day (BID) | INTRAVENOUS | Status: DC
  Administered 2018-11-22: 2 g via INTRAVENOUS
  Filled 2018-11-22: qty 20

## 2018-11-22 MED ORDER — SODIUM CHLORIDE 0.9 % IV SOLN
2.0000 g | INTRAVENOUS | Status: DC
  Filled 2018-11-22 (×3): qty 2000

## 2018-11-22 MED ORDER — VANCOMYCIN HCL IN DEXTROSE 1-5 GM/200ML-% IV SOLN
1000.0000 mg | INTRAVENOUS | Status: DC
  Administered 2018-11-23: 04:00:00 1000 mg via INTRAVENOUS
  Filled 2018-11-22: qty 200

## 2018-11-22 MED ORDER — VANCOMYCIN HCL 10 G IV SOLR
1750.0000 mg | Freq: Once | INTRAVENOUS | Status: AC
  Administered 2018-11-22: 05:00:00 1750 mg via INTRAVENOUS
  Filled 2018-11-22: qty 1750

## 2018-11-22 MED ORDER — LORAZEPAM 2 MG/ML IJ SOLN
INTRAMUSCULAR | Status: AC
  Filled 2018-11-22: qty 1

## 2018-11-22 MED ORDER — GADOBUTROL 1 MMOL/ML IV SOLN
8.0000 mL | Freq: Once | INTRAVENOUS | Status: AC | PRN
  Administered 2018-11-22: 8 mL via INTRAVENOUS

## 2018-11-22 MED ORDER — HALOPERIDOL LACTATE 5 MG/ML IJ SOLN
2.0000 mg | Freq: Once | INTRAMUSCULAR | Status: AC
  Administered 2018-11-22: 21:00:00 2 mg via INTRAVENOUS
  Filled 2018-11-22: qty 1

## 2018-11-22 MED ORDER — SODIUM CHLORIDE 0.9 % IV SOLN
2.0000 g | Freq: Four times a day (QID) | INTRAVENOUS | Status: DC
  Administered 2018-11-22 – 2018-11-24 (×7): 2 g via INTRAVENOUS
  Filled 2018-11-22 (×12): qty 2

## 2018-11-22 MED ORDER — DIPHENHYDRAMINE HCL 50 MG/ML IJ SOLN
50.0000 mg | Freq: Once | INTRAMUSCULAR | Status: AC
  Administered 2018-11-22: 05:00:00 50 mg via INTRAVENOUS
  Filled 2018-11-22: qty 1

## 2018-11-22 MED ORDER — DIPHENHYDRAMINE HCL 50 MG/ML IJ SOLN: 25.0000 mg | Freq: Once | INTRAMUSCULAR | Status: DC

## 2018-11-22 NOTE — Progress Notes (Addendum)
Pharmacy Antibiotic Note  Anna Rojas is a 63 y.o. 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  Pharmacy has been consulted for vancomycin and acyclovir, respectively, dosing.  Presenting with aphasia and AMS - concern with alcohol being involved given long period of sobriety per notes. Temp 102.4, WBC 8.9, Scr 0.5. Has hx of ceftriaxone allergy listed due to rash in 2019 (however, tolerated in 2017) - discussed with MD, will continue with ceftriaxone and monitor closely if need to use alternative agent.   Plan: Vancomycin 1750 mg IV once then 1 g IV every 24 hours (estAUC 489 using SCr 0.8)  Acyclovir 815 mg (10 mg/kg) IV every 8 hours Ceftriaxone 2 g IV every 12 hours Monitor renal function, cx results, clinical pic, and neuro recommendations   Height: 5\' 1"  (154.9 cm) Weight: 180 lb (81.6 kg) IBW/kg (Calculated) : 47.8  Temp (24hrs), Avg:99.9 F (37.7 C), Min:98.3 F (36.8 C), Max:102.4 F (39.1 C)  Recent Labs  Lab 11/21/18 1804 11/21/18 1811  WBC 8.9  --   CREATININE 0.64 0.50    Estimated Creatinine Clearance: 70.6 mL/min (by C-G formula based on SCr of 0.5 mg/dL).    Allergies  Allergen Reactions  . Beclomethasone Itching, Rash and Other (See Comments)    Flushing skin redness, also  . Brimonidine Hives and Itching  . Codeine Hives and Rash  . Darvon [Propoxyphene] Hives  . Iodinated Diagnostic Agents Hives  . Iodine-131 Hives  . Oxycodone Hives and Rash  . Tranexamic Acid Itching and Rash  . Ceftriaxone Sodium In Dextrose Rash    12/29/17 Developed diffuse pruritic rash on chest, face, hips, arms after finishing rocephin dose.  Patient did have rocephin in 2017 without a problem though...  . Norethindrone Rash  . Brimonidine Tartrate Hives and Itching  . Celecoxib Itching and Rash  . Oxycontin [Oxycodone Hcl] Hives and Rash    Antimicrobials this admission: Ampicillin 10/2 >>  Vancomycin 10/2 >>  Ceftriaxone 10/2>> Acyclovir  10/2>>  Dose adjustments this admission: N/A  Microbiology results: 10/2 BCx: sent 10/2 UCx: sent  10/2 COVID: neg   Thank you for allowing pharmacy to be a part of this patient's care.  Antonietta Jewel, PharmD, BCCCP Clinical Pharmacist   Please check AMION for all [place] phone numbers After 10:00 PM, call [place] [phone] 11/22/2018 2:48 AM   ADDENDUM Following dose of ceftriaxone, nursing noticed start of rash appearing. Discussed with MD, will stop ceftriaxone (give benadryl) and start aztreonam 2g IV every 6 hours instead. Will update allergy.  Antonietta Jewel, PharmD, [place] Clinical Pharmacist

## 2018-11-22 NOTE — Progress Notes (Signed)
PT Cancellation Note  Patient Details Name: Anna Rojas MRN: [id number] DOB: 09-10-1955   Cancelled Treatment:    Reason Eval/Treat Not Completed: Fatigue/lethargy limiting ability to participate RN reports pt with increased lethargy and having EEG. Will hold until pt medically appropriate and follow up as schedule allows.   Leighton Ruff, PT, DPT  Acute Rehabilitation Services  Pager: [phone] Office: [phone]    Rudean Hitt 11/22/2018, 11:40 AM

## 2018-11-22 NOTE — Progress Notes (Addendum)
PROGRESS NOTE    Anna Rojas  YSA:[id number] DOB: [birth year] DOA: 11/21/2018 PCP: Patient, No Pcp Per   Brief Narrative: Anna Rojas is a 63 y.o. female with history of torsades status post ICD placement, history of complete heart block status post pacemaker placement, diabetes mellitus, hypothyroidism. Patient presented secondary to confusion with unknown etiology. Obtunded.   Assessment & Plan:   Principal Problem:   Acute encephalopathy Active Problems:   Uncontrolled type 2 diabetes mellitus with hyperglycemia (HCC)   Hypothyroidism   Acute metabolic encephalopathy   Acute encephalopathy Unknown etiology. Concern for viral etiology with fevers. Unable to obtain MRI secondary to ICD.  -Neurology recommendations: LP, EEG -Vancomycin, Acyclovir, Aztreonam -MRI pending  Fever Unknown etiology but possibly related to mental status change. procalcitonin is undetectable. No leukocytosis.  Diabetes mellitus, type 2 -Continue SSI q4 hours  Hypothyroidism Patient's TSH is <0.01 on admission. She takes Synthroid 300 mcg as an outpatient. Unsure if this is primary vs secondary hypothyroidism at this point.  -Obtain free T3/T4 to ensure this is primary likely from overtreatment -Hold Synthroid for now. Held since 10/2  History of torsades Patient is s/p ICD.   DVT prophylaxis: SCDs Code Status:   Code Status: Full Code Family Communication: None at bedside Disposition Plan: Discharge pending clinical improvement   Consultants:   Neurology  Procedures:   None  Antimicrobials:  Vancomycin  Aztreonam  Acyclovir  Ceftriaxone   Subjective: Patient obtunded  Objective: Vitals:   11/22/18 0645 11/22/18 0700 11/22/18 0730 11/22/18 0800  BP:  97/64 114/64 105/65  Pulse: 88 87 85 82  Resp: 15 13 14 19   Temp:      TempSrc:      SpO2: 100% 100% 100% 100%  Weight:      Height:        Intake/Output Summary (Last 24 hours) at 11/22/2018 1110 Last data  filed at 11/22/2018 0820 Gross per 24 hour  Intake 500 ml  Output 0 ml  Net 500 ml   Filed Weights   11/22/18 0230  Weight: 81.6 kg    Examination:  General exam: Appears calm and comfortable Respiratory system: Clear to auscultation. Respiratory effort normal. Cardiovascular system: S1 & S2 heard, RRR. No murmurs, rubs, gallops or clicks. Gastrointestinal system: Abdomen is nondistended, soft and nontender. No organomegaly or masses felt. Normal bowel sounds heard. Central nervous system: Obtunded Extremities: No edema. No calf tenderness Skin: No cyanosis. No rashes Psychiatry: Judgement and insight appear impaired    Data Reviewed: I have personally reviewed following labs and imaging studies  CBC: Recent Labs  Lab 11/21/18 1804 11/21/18 1811 11/22/18 0256  WBC 8.9  --  7.8  NEUTROABS 6.6  --   --   HGB 14.9 15.0 13.8  HCT 44.4 44.0 41.3  MCV 87.9  --  88.6  PLT 224  --  323   Basic Metabolic Panel: Recent Labs  Lab 11/21/18 1804 11/21/18 1811 11/22/18 0256  NA 140 139 142  K 4.5 4.2 3.4*  CL 105 105 105  CO2 24  --  23  GLUCOSE 162* 153* 122*  BUN 9 11 10   CREATININE 0.64 0.50 0.68  CALCIUM 8.9  --  8.9   GFR: Estimated Creatinine Clearance: 70.6 mL/min (by C-G formula based on SCr of 0.68 mg/dL). Liver Function Tests: Recent Labs  Lab 11/21/18 1804 11/22/18 0256  AST 77* 56*  ALT 36 31  ALKPHOS 142* 126  BILITOT 1.1 1.0  PROT 6.4* 6.2*  ALBUMIN 3.0* 2.9*   No results for input(s): LIPASE, AMYLASE in the last 168 hours. Recent Labs  Lab 11/21/18 1920  AMMONIA 10   Coagulation Profile: Recent Labs  Lab 11/21/18 1804  INR 1.2   Cardiac Enzymes: No results for input(s): CKTOTAL, CKMB, CKMBINDEX, TROPONINI in the last 168 hours. BNP (last 3 results) No results for input(s): PROBNP in the last 8760 hours. HbA1C: Recent Labs    11/21/18 2238 11/22/18 0256  HGBA1C 11.0* 11.1*   CBG: Recent Labs  Lab 11/21/18 1803 11/21/18 2313  11/22/18 0050 11/22/18 0439  GLUCAP 160* 149* 132* 154*   Lipid Profile: Recent Labs    11/22/18 0257  CHOL 150  HDL 39*  LDLCALC 85  TRIG [id number]  CHOLHDL 3.8   Thyroid Function Tests: Recent Labs    11/21/18 1920  TSH <0.010*   Anemia Panel: No results for input(s): VITAMINB12, FOLATE, FERRITIN, TIBC, IRON, RETICCTPCT in the last 72 hours. Sepsis Labs: Recent Labs  Lab 11/22/18 0256 11/22/18 1027  PROCALCITON <0.10  --   LATICACIDVEN  --  0.8    Recent Results (from the past 240 hour(s))  SARS Coronavirus 2 [facility](Hospital order, Performed in [facility] hospital lab) Nasopharyngeal Nasopharyngeal Swab     Status: None   Collection Time: 11/21/18  6:40 PM   Specimen: Nasopharyngeal Swab  Result Value Ref Range Status   SARS Coronavirus 2 NEGATIVE NEGATIVE Final    Comment: (NOTE) If result is NEGATIVE SARS-CoV-2 target nucleic acids are NOT DETECTED. The SARS-CoV-2 RNA is generally detectable in upper and lower  respiratory specimens during the acute phase of infection. The lowest  concentration of SARS-CoV-2 viral copies this assay can detect is 250  copies / mL. A negative result does not preclude SARS-CoV-2 infection  and should not be used as the sole basis for treatment or other  patient management decisions.  A negative result may occur with  improper specimen collection / handling, submission of specimen other  than nasopharyngeal swab, presence of viral mutation(s) within the  areas targeted by this assay, and inadequate number of viral copies  (<250 copies / mL). A negative result must be combined with clinical  observations, patient history, and epidemiological information. If result is POSITIVE SARS-CoV-2 target nucleic acids are DETECTED. The SARS-CoV-2 RNA is generally detectable in upper and lower  respiratory specimens dur ing the acute phase of infection.  Positive  results are indicative of active infection with SARS-CoV-2.  Clinical  correlation  with patient history and other diagnostic information is  necessary to determine patient infection status.  Positive results do  not rule out bacterial infection or co-infection with other viruses. If result is PRESUMPTIVE POSTIVE SARS-CoV-2 nucleic acids MAY BE PRESENT.   A presumptive positive result was obtained on the submitted specimen  and confirmed on repeat testing.  While 2019 novel coronavirus  (SARS-CoV-2) nucleic acids may be present in the submitted sample  additional confirmatory testing may be necessary for epidemiological  and / or clinical management purposes  to differentiate between  SARS-CoV-2 and other Sarbecovirus currently known to infect humans.  If clinically indicated additional testing with an alternate test  methodology [phone] is advised. The SARS-CoV-2 RNA is generally  detectable in upper and lower respiratory sp ecimens during the acute  phase of infection. The expected result is Negative. Fact Sheet for Patients:  BoilerBrush.com.cyhttps://www.fda.gov/media/136312/download Fact Sheet for Healthcare Providers: https://pope.com/https://www.fda.gov/media/136313/download This test is not yet approved or cleared by the [place] FDA  and has been authorized for detection and/or diagnosis of SARS-CoV-2 by FDA under an Emergency Use Authorization (EUA).  This EUA will remain in effect (meaning this test can be used) for the duration of the COVID-19 declaration under Section 564(b)(1) of the Act, 21 U.S.C. section 360bbb-3(b)(1), unless the authorization is terminated or revoked sooner. Performed at [facility], [address] [place], [place] [address]          Radiology Studies: Ct Angio Head W Or Wo Contrast  Result Date: 11/21/2018 CLINICAL DATA:  Focal neuro deficit greater than 6 hours. Right-sided weakness and aphasia. EXAM: CT ANGIOGRAPHY HEAD AND NECK CT PERFUSION BRAIN TECHNIQUE: Multidetector CT imaging of the head and neck was performed using the standard  protocol during bolus administration of intravenous contrast. Multiplanar CT image reconstructions and MIPs were obtained to evaluate the vascular anatomy. Carotid stenosis measurements (when applicable) are obtained utilizing NASCET criteria, using the distal internal carotid diameter as the denominator. Multiphase CT imaging of the brain was performed following IV bolus contrast injection. Subsequent parametric perfusion maps were calculated using RAPID software. CONTRAST:  OMNIPAQUE IOHEXOL 350 MG/ML SOLN COMPARISON:  CT head 11/21/2018 FINDINGS: CTA NECK FINDINGS Aortic arch: Standard branching. Imaged portion shows no evidence of aneurysm or dissection. No significant stenosis of the major arch vessel origins. Right carotid system: No significant right carotid stenosis. Small calcific plaque at the origin of the right external carotid artery. Left carotid system: Mild atherosclerotic disease left carotid bifurcation. No significant left carotid stenosis. Vertebral arteries: Both vertebral arteries patent to the basilar without significant stenosis. Skeleton: Mild degenerative changes in the cervical spine. No acute skeletal abnormality. Other neck: Negative for mass or adenopathy. Upper chest: Left-sided pacemaker. Lung apices clear bilaterally. Review of the MIP images confirms the above findings CTA HEAD FINDINGS Anterior circulation: Cavernous carotid widely patent bilaterally without significant calcification or stenosis. Anterior and middle cerebral arteries widely patent bilaterally. Posterior circulation: Both vertebral arteries patent to the basilar. PICA patent bilaterally. Basilar widely patent. Superior cerebellar and posterior cerebral arteries patent bilaterally. Venous sinuses: Minimal venous contrast due to timing of the scan Anatomic variants: None Review of the MIP images confirms the above findings CT Brain Perfusion Findings: ASPECTS: 10 CBF (<30%) Volume: 0mL Perfusion (Tmax>6.0s)  volume: 0mL Mismatch Volume: 0mL Infarction Location:None IMPRESSION: 1. CT perfusion negative for acute ischemia or infarct.  Aspect 10 2. No significant carotid or vertebral artery stenosis in the neck. 3. No significant cranial stenosis or large vessel occlusion. Electronically Signed   By: Marlan Palau M.D.   On: 11/21/2018 18:54   Ct Angio Neck W Or Wo Contrast  Result Date: 11/21/2018 CLINICAL DATA:  Focal neuro deficit greater than 6 hours. Right-sided weakness and aphasia. EXAM: CT ANGIOGRAPHY HEAD AND NECK CT PERFUSION BRAIN TECHNIQUE: Multidetector CT imaging of the head and neck was performed using the standard protocol during bolus administration of intravenous contrast. Multiplanar CT image reconstructions and MIPs were obtained to evaluate the vascular anatomy. Carotid stenosis measurements (when applicable) are obtained utilizing NASCET criteria, using the distal internal carotid diameter as the denominator. Multiphase CT imaging of the brain was performed following IV bolus contrast injection. Subsequent parametric perfusion maps were calculated using RAPID software. CONTRAST:  OMNIPAQUE IOHEXOL 350 MG/ML SOLN COMPARISON:  CT head 11/21/2018 FINDINGS: CTA NECK FINDINGS Aortic arch: Standard branching. Imaged portion shows no evidence of aneurysm or dissection. No significant stenosis of the major arch vessel origins. Right carotid system:  No significant right carotid stenosis. Small calcific plaque at the origin of the right external carotid artery. Left carotid system: Mild atherosclerotic disease left carotid bifurcation. No significant left carotid stenosis. Vertebral arteries: Both vertebral arteries patent to the basilar without significant stenosis. Skeleton: Mild degenerative changes in the cervical spine. No acute skeletal abnormality. Other neck: Negative for mass or adenopathy. Upper chest: Left-sided pacemaker. Lung apices clear bilaterally. Review of the MIP images confirms  the above findings CTA HEAD FINDINGS Anterior circulation: Cavernous carotid widely patent bilaterally without significant calcification or stenosis. Anterior and middle cerebral arteries widely patent bilaterally. Posterior circulation: Both vertebral arteries patent to the basilar. PICA patent bilaterally. Basilar widely patent. Superior cerebellar and posterior cerebral arteries patent bilaterally. Venous sinuses: Minimal venous contrast due to timing of the scan Anatomic variants: None Review of the MIP images confirms the above findings CT Brain Perfusion Findings: ASPECTS: 10 CBF (<30%) Volume: 0mL Perfusion (Tmax>6.0s) volume: 0mL Mismatch Volume: 0mL Infarction Location:None IMPRESSION: 1. CT perfusion negative for acute ischemia or infarct.  Aspect 10 2. No significant carotid or vertebral artery stenosis in the neck. 3. No significant cranial stenosis or large vessel occlusion. Electronically Signed   By: Marlan Palau M.D.   On: 11/21/2018 18:54   Ct C-spine No Charge  Result Date: 11/21/2018 CLINICAL DATA:  63 year old female code stroke presentation today. Right side weakness. EXAM: CT CERVICAL SPINE WITH CONTRAST TECHNIQUE: Multiplanar CT images of the cervical spine were reconstructed from contemporary CTA of the Neck. CONTRAST:  No additional COMPARISON:  CT head, CTA head and neck, and CT perfusion earlier today. FINDINGS: Alignment: Straightening of cervical lordosis. Cervicothoracic junction alignment is within normal limits. Bilateral posterior element alignment is within normal limits. Skull base and vertebrae: Mild motion degradation at C2. Visualized skull base is intact. No atlanto-occipital dissociation. No acute osseous abnormality identified. Soft tissues and spinal canal: No prevertebral fluid or swelling. No visible canal hematoma. Neck soft tissues reported with the CTA today separately. Disc levels: Widespread cervical spine degeneration. There is ankylosis of the C2-C3 posterior  elements on the right. There is advanced disc and endplate degeneration C4-C5 through C6-C7. 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 Subsequently, there is mild and up to moderate multilevel cervical spinal stenosis, likely maximal at C5-C6. Upper chest: Upper thoracic levels appear intact. Other chest findings reported on the CTA today separately. IMPRESSION: 1. No acute osseous abnormality in the cervical spine. 2. 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 Degenerative cervical spinal stenosis from C4-C5 to C6-C7, up to moderate at C5-C6. 3. CTA head and neck today reported separately. Electronically Signed   By: Odessa Fleming M.D.   On: 11/21/2018 21:00   Ct Cerebral Perfusion W Contrast  Result Date: 11/21/2018 CLINICAL DATA:  Focal neuro deficit greater than 6 hours. Right-sided weakness and aphasia. EXAM: CT ANGIOGRAPHY HEAD AND NECK CT PERFUSION BRAIN TECHNIQUE: Multidetector CT imaging of the head and neck was performed using the standard protocol during bolus administration of intravenous contrast. Multiplanar CT image reconstructions and MIPs were obtained to evaluate the vascular anatomy. Carotid stenosis measurements (when applicable) are obtained utilizing NASCET criteria, using the distal internal carotid diameter as the denominator. Multiphase CT imaging of the brain was performed following IV bolus contrast injection. Subsequent parametric perfusion maps were calculated using RAPID software. CONTRAST:  OMNIPAQUE IOHEXOL 350 MG/ML SOLN COMPARISON:  CT head 11/21/2018 FINDINGS: CTA NECK FINDINGS Aortic arch: Standard branching. Imaged portion shows  no evidence of aneurysm or dissection. No significant stenosis of the major arch vessel origins. Right carotid system: No significant right carotid stenosis. Small calcific plaque at the origin of the right external carotid artery. Left carotid system: Mild atherosclerotic  disease left carotid bifurcation. No significant left carotid stenosis. Vertebral arteries: Both vertebral arteries patent to the basilar without significant stenosis. Skeleton: Mild degenerative changes in the cervical spine. No acute skeletal abnormality. Other neck: Negative for mass or adenopathy. Upper chest: Left-sided pacemaker. Lung apices clear bilaterally. Review of the MIP images confirms the above findings CTA HEAD FINDINGS Anterior circulation: Cavernous carotid widely patent bilaterally without significant calcification or stenosis. Anterior and middle cerebral arteries widely patent bilaterally. Posterior circulation: Both vertebral arteries patent to the basilar. PICA patent bilaterally. Basilar widely patent. Superior cerebellar and posterior cerebral arteries patent bilaterally. Venous sinuses: Minimal venous contrast due to timing of the scan Anatomic variants: None Review of the MIP images confirms the above findings CT Brain Perfusion Findings: ASPECTS: 10 CBF (<30%) Volume: 0mL Perfusion (Tmax>6.0s) volume: 0mL Mismatch Volume: 0mL Infarction Location:None IMPRESSION: 1. CT perfusion negative for acute ischemia or infarct.  Aspect 10 2. No significant carotid or vertebral artery stenosis in the neck. 3. No significant cranial stenosis or large vessel occlusion. Electronically Signed   By: Marlan Palau M.D.   On: 11/21/2018 18:54   Dg Chest Port 1 View  Result Date: 11/22/2018 CLINICAL DATA:  Altered mental status, fever EXAM: PORTABLE CHEST 1 VIEW COMPARISON:  Radiograph August 19, 2018 FINDINGS: Dual lead pacer/defibrillator pack overlies the left chest wall with leads at the right atrium and cardiac apex. No consolidation, features of edema, pneumothorax, or effusion. Pulmonary vascularity is normally distributed. The cardiomediastinal contours are unremarkable. No acute osseous or soft tissue abnormality. IMPRESSION: No acute cardiopulmonary abnormality. Electronically Signed   By: Kreg Shropshire M.D.   On: 11/22/2018 01:49   Ct Head Code Stroke Wo Contrast  Result Date: 11/21/2018 CLINICAL DATA:  Code stroke. Focal neuro deficit greater than 6 hours. Rule out stroke. EXAM: CT HEAD WITHOUT CONTRAST TECHNIQUE: Contiguous axial images were obtained from the base of the skull through the vertex without intravenous contrast. COMPARISON:  None. FINDINGS: Brain: Mild atrophy. Negative for acute infarct, hemorrhage, mass. Mild white matter changes appear chronic. Vascular: Negative for hyperdense vessel Skull: Negative Sinuses/Orbits: Negative Other: None ASPECTS ([place] Stroke Program Early CT Score) - Ganglionic level infarction (caudate, lentiform nuclei, internal capsule, insula, M1-M3 cortex): 7 - Supraganglionic infarction (M4-M6 cortex): 3 Total score (0-10 with 10 being normal): 10 IMPRESSION: 1. No acute abnormality 2. ASPECTS is 10 3. These results were called by telephone at the time of interpretation on 11/21/2018 at 6:35 pm to provider Amada Jupiter MD , who verbally acknowledged these results. Electronically Signed   By: Marlan Palau M.D.   On: 11/21/2018 18:37        Scheduled Meds:   stroke: mapping our early stages of recovery book   Does not apply Once   aspirin  300 mg Rectal Daily   Or   aspirin  325 mg Oral Daily   insulin aspart  0-9 Units Subcutaneous Q4H   levothyroxine  137.5 mcg Intravenous Daily   mometasone-formoterol  2 puff Inhalation BID   Continuous Infusions:  sodium chloride 125 mL/hr at 11/22/18 0515   acyclovir Stopped (11/22/18 0430)   aztreonam     [START ON 11/23/2018] vancomycin       LOS: 0 days  Jacquelin Hawking, MD [facility] 11/22/2018, 11:10 AM  If 7PM-7AM, please contact night-coverage [email or web address]

## 2018-11-22 NOTE — ED Notes (Addendum)
Spoke with pharmacy about how pt has listed allergy to Rocephin. Rash listed as reaction. Pharmacist stated to give antibiotic to pt but to watch closely to see if she develops a rash. After administration pt developed rash/hives on upper thighs and lower abd. Pharmacist and MD paged. Orders placed and medications given. Will continue to monitor. Pt feels warm to touch but recheck on rectal temp shows it being a normal temp. Update given to MD

## 2018-11-22 NOTE — Procedures (Signed)
Patient Name: Anna Rojas  MRN: [id number]  Epilepsy Attending: Lora Havens  Referring Physician/Provider: Dr. Gean Birchwood Date: 11/22/2018 Duration: 23.32 mins  Patient history: 63 year old female with altered mental status.  EEG to evaluate for seizures.  Level of alertness: Awake/lethargic, sleep  AEDs during EEG study: None  Technical aspects: This EEG study was done with scalp electrodes positioned according to the 10-20 International system of electrode placement. Electrical activity was acquired at a sampling rate of 500Hz  and reviewed with a high frequency filter of 70Hz  and a low frequency filter of 1Hz . EEG data were recorded continuously and digitally stored.   Description: During awake state, no clear posterior dominant rhythm was seen.  During sleep, vertex waves and sleep spindles (12 to 14 Hz), maximal frontocentral were seen.  There was also continuous low voltage 2 to 5 Hz theta-delta slowing seen. Hyperventilation and photic stimulation were not performed.  Abnormality -Continuous slow, generalized  IMPRESSION: This study is suggestive of moderate diffuse encephalopathy, nonspecific to etiology. No seizures or epileptiform discharges were seen throughout the recording.  Janessa Mickle Barbra Sarks

## 2018-11-22 NOTE — ED Notes (Signed)
ED TO INPATIENT HANDOFF REPORT  ED Nurse Name and Phone #:  Marsh Dolly [phone]  S Name/Age/Gender Beatris Ship 63 y.o. female Room/Bed: 014C/014C  Code Status   Code Status: Full Code  Home/SNF/Other Home Patient oriented to: disoriented x 4 Is this baseline? No   Triage Complete: Triage complete  Chief Complaint Code STROKE  Triage Note Pt arrived via ems; Code Stroke initiated; last known well is unclear. Reported ex-husband called emergency services d/t altered mental status and garbled speech.    Allergies Allergies  Allergen Reactions  . Beclomethasone Itching, Rash and Other (See Comments)    Flushing skin redness, also  . Brimonidine Hives and Itching  . Codeine Hives and Rash  . Darvon [Propoxyphene] Hives  . Iodinated Diagnostic Agents Hives  . Iodine-131 Hives  . Oxycodone Hives and Rash  . Tranexamic Acid Itching and Rash  . Ceftriaxone Sodium In Dextrose Rash    12/29/17 Developed diffuse pruritic rash on chest, face, hips, arms after finishing rocephin dose.  Patient did have rocephin in 2017 without a problem though...  11/22/2018 - unable to tolerate retrial of ceftriaxone, rash developed after dose.   . Norethindrone Rash  . Brimonidine Tartrate Hives and Itching  . Celecoxib Itching and Rash  . Oxycontin [Oxycodone Hcl] Hives and Rash    Level of Care/Admitting Diagnosis ED Disposition    ED Disposition Condition Comment   Admit  Hospital Area: [facility] [[id number]]  Level of Care: Progressive [102]  Covid Evaluation: N/A  Diagnosis: Acute encephalopathy [098119]  Admitting Physician: Eduard Clos [phone]  Attending Physician: Eduard Clos [phone]  Estimated length of stay: past midnight tomorrow  Certification:: I certify this patient will need inpatient services for at least 2 midnights  PT Class (Do Not Modify): Inpatient [101]  PT Acc Code (Do Not Modify): Private [1]       B Medical/Surgery  History Past Medical History:  Diagnosis Date  . Diabetes mellitus without complication (HCC)   . Hypothyroidism    Past Surgical History:  Procedure Laterality Date  . CARDIAC DEFIBRILLATOR PLACEMENT    . PACEMAKER PLACEMENT       A IV Location/Drains/Wounds Patient Lines/Drains/Airways Status   Active Line/Drains/Airways    Name:   Placement date:   Placement time:   Site:   Days:   Peripheral IV 11/21/18 Right Antecubital   11/21/18    1815    Antecubital   1   Peripheral IV 11/21/18 Right Arm   11/21/18    1922    Arm   1   Peripheral IV 11/21/18 Left Hand   11/21/18    1922    Hand   1          Intake/Output Last 24 hours  Intake/Output Summary (Last 24 hours) at 11/22/2018 0857 Last data filed at 11/22/2018 0820 Gross per 24 hour  Intake 500 ml  Output 0 ml  Net 500 ml    Labs/Imaging Results for orders placed or performed during the hospital encounter of 11/21/18 (from the past 48 hour(s))  CBG monitoring, ED     Status: Abnormal   Collection Time: 11/21/18  6:03 PM  Result Value Ref Range   Glucose-Capillary 160 (H) 70 - 99 mg/dL  Protime-INR     Status: None   Collection Time: 11/21/18  6:04 PM  Result Value Ref Range   Prothrombin Time 15.2 11.4 - 15.2 seconds   INR 1.2 0.8 - 1.2  Comment: (NOTE) INR goal varies based on device and disease states. Performed at [facility], [address], [place], [place] [address]   APTT     Status: None   Collection Time: 11/21/18  6:04 PM  Result Value Ref Range   aPTT 34 24 - 36 seconds    Comment: Performed at [facility], [address] [place], [place] [address]  CBC     Status: None   Collection Time: 11/21/18  6:04 PM  Result Value Ref Range   WBC 8.9 4.0 - 10.5 K/uL   RBC 5.05 3.87 - 5.11 MIL/uL   Hemoglobin 14.9 12.0 - 15.0 g/dL   HCT 29.5 62.1 - 30.8 %   MCV 87.9 80.0 - 100.0 fL   MCH 29.5 26.0 - 34.0 pg   MCHC 33.6 30.0 - 36.0 g/dL   RDW 65.7 84.6 - 96.2 %   Platelets 224 150 -  400 K/uL   nRBC 0.0 0.0 - 0.2 %    Comment: Performed at [facility], [address] [place], [place] [address]  Differential     Status: None   Collection Time: 11/21/18  6:04 PM  Result Value Ref Range   Neutrophils Relative % 75 %   Neutro Abs 6.6 1.7 - 7.7 K/uL   Lymphocytes Relative 16 %   Lymphs Abs 1.4 0.7 - 4.0 K/uL   Monocytes Relative 8 %   Monocytes Absolute 0.7 0.1 - 1.0 K/uL   Eosinophils Relative 1 %   Eosinophils Absolute 0.1 0.0 - 0.5 K/uL   Basophils Relative 0 %   Basophils Absolute 0.0 0.0 - 0.1 K/uL   Immature Granulocytes 0 %   Abs Immature Granulocytes 0.02 0.00 - 0.07 K/uL    Comment: Performed at [facility], [address] [place], [place] [address]  Comprehensive metabolic panel     Status: Abnormal   Collection Time: 11/21/18  6:04 PM  Result Value Ref Range   Sodium 140 135 - 145 mmol/L   Potassium 4.5 3.5 - 5.1 mmol/L   Chloride 105 98 - 111 mmol/L   CO2 24 22 - 32 mmol/L   Glucose, Bld 162 (H) 70 - 99 mg/dL   BUN 9 8 - 23 mg/dL   Creatinine, Ser 0.10 0.44 - 1.00 mg/dL   Calcium 8.9 8.9 - 27.2 mg/dL   Total Protein 6.4 (L) 6.5 - 8.1 g/dL   Albumin 3.0 (L) 3.5 - 5.0 g/dL   AST 77 (H) 15 - 41 U/L   ALT 36 0 - 44 U/L   Alkaline Phosphatase 142 (H) 38 - 126 U/L   Total Bilirubin 1.1 0.3 - 1.2 mg/dL   GFR calc non Af Amer >60 >60 mL/min   GFR calc Af Amer >60 >60 mL/min   Anion gap 11 5 - 15    Comment: Performed at [facility], [address] [place], [place] [address]  I-stat chem 8, ED     Status: Abnormal   Collection Time: 11/21/18  6:11 PM  Result Value Ref Range   Sodium 139 135 - 145 mmol/L   Potassium 4.2 3.5 - 5.1 mmol/L   Chloride 105 98 - 111 mmol/L   BUN 11 8 - 23 mg/dL   Creatinine, Ser 4.03 0.44 - 1.00 mg/dL   Glucose, Bld 474 (H) 70 - 99 mg/dL   Calcium, Ion 2.59 (L) 1.15 - 1.40 mmol/L   TCO2 23 22 -  32 mmol/L   Hemoglobin 15.0 12.0 - 15.0 g/dL   HCT 16.1 09.6 - 04.5 %  SARS Coronavirus 2 [facility]  order, Performed in [facility] hospital lab) Nasopharyngeal Nasopharyngeal Swab     Status: None   Collection Time: 11/21/18  6:40 PM   Specimen: Nasopharyngeal Swab  Result Value Ref Range   SARS Coronavirus 2 NEGATIVE NEGATIVE    Comment: (NOTE) If result is NEGATIVE SARS-CoV-2 target nucleic acids are NOT DETECTED. The SARS-CoV-2 RNA is generally detectable in upper and lower  respiratory specimens during the acute phase of infection. The lowest  concentration of SARS-CoV-2 viral copies this assay can detect is 250  copies / mL. A negative result does not preclude SARS-CoV-2 infection  and should not be used as the sole basis for treatment or other  patient management decisions.  A negative result may occur with  improper specimen collection / handling, submission of specimen other  than nasopharyngeal swab, presence of viral mutation(s) within the  areas targeted by this assay, and inadequate number of viral copies  (<250 copies / mL). A negative result must be combined with clinical  observations, patient history, and epidemiological information. If result is POSITIVE SARS-CoV-2 target nucleic acids are DETECTED. The SARS-CoV-2 RNA is generally detectable in upper and lower  respiratory specimens dur ing the acute phase of infection.  Positive  results are indicative of active infection with SARS-CoV-2.  Clinical  correlation with patient history and other diagnostic information is  necessary to determine patient infection status.  Positive results do  not rule out bacterial infection or co-infection with other viruses. If result is PRESUMPTIVE POSTIVE SARS-CoV-2 nucleic acids MAY BE PRESENT.   A presumptive positive result was obtained on the submitted specimen  and confirmed on repeat testing.  While 2019 novel coronavirus  (SARS-CoV-2) nucleic acids may be present in the submitted sample  additional confirmatory testing may be necessary for epidemiological  and / or  clinical management purposes  to differentiate between  SARS-CoV-2 and other Sarbecovirus currently known to infect humans.  If clinically indicated additional testing with an alternate test  methodology [phone]) is advised. The SARS-CoV-2 RNA is generally  detectable in upper and lower respiratory sp ecimens during the acute  phase of infection. The expected result is Negative. Fact Sheet for Patients:  BoilerBrush.com.cy Fact Sheet for Healthcare Providers: https://pope.com/ This test is not yet approved or cleared by the [place] FDA and has been authorized for detection and/or diagnosis of SARS-CoV-2 by FDA under an Emergency Use Authorization (EUA).  This EUA will remain in effect (meaning this test can be used) for the duration of the COVID-19 declaration under Section 564(b)(1) of the Act, 21 U.S.C. section 360bbb-3(b)(1), unless the authorization is terminated or revoked sooner. Performed at [facility] Lab, [address] [place], [place] [address]   TSH     Status: Abnormal   Collection Time: 11/21/18  7:20 PM  Result Value Ref Range   TSH <0.010 (L) 0.350 - 4.500 uIU/mL    Comment: Performed by a 3rd Generation assay with a functional sensitivity of <=0.01 uIU/mL. Performed at [facility], [address] [place], [place] [address]   Ammonia     Status: None   Collection Time: 11/21/18  7:20 PM  Result Value Ref Range   Ammonia 10 9 - 35 umol/L    Comment: Performed at [facility], [address] [place], [place] [address]  Acetaminophen level  Status: Abnormal   Collection Time: 11/21/18  7:27 PM  Result Value Ref Range   Acetaminophen (Tylenol), Serum <10 (L) 10 - 30 ug/mL    Comment: (NOTE) Therapeutic concentrations vary significantly. A range of 10-30 ug/mL  may be an effective concentration for many patients. However, some  are best treated at concentrations outside of this  range. Acetaminophen concentrations >150 ug/mL at 4 hours after ingestion  and >50 ug/mL at 12 hours after ingestion are often associated with  toxic reactions. Performed at [facility], [address] [place], [place] [address]   Ethanol     Status: None   Collection Time: 11/21/18  7:27 PM  Result Value Ref Range   Alcohol, Ethyl (B) <10 <10 mg/dL    Comment: (NOTE) Lowest detectable limit for serum alcohol is 10 mg/dL. For medical purposes only. Performed at [facility], [address] [place], [place] [address]   Salicylate level     Status: None   Collection Time: 11/21/18  7:27 PM  Result Value Ref Range   Salicylate Lvl <7.0 2.8 - 30.0 mg/dL    Comment: Performed at [facility], [address], [place], [place] [address]  Urine rapid drug screen (hosp performed)     Status: None   Collection Time: 11/21/18  7:30 PM  Result Value Ref Range   Opiates NONE DETECTED NONE DETECTED   Cocaine NONE DETECTED NONE DETECTED   Benzodiazepines NONE DETECTED NONE DETECTED   Amphetamines NONE DETECTED NONE DETECTED   Tetrahydrocannabinol NONE DETECTED NONE DETECTED   Barbiturates NONE DETECTED NONE DETECTED    Comment: (NOTE) DRUG SCREEN FOR MEDICAL PURPOSES ONLY.  IF CONFIRMATION IS NEEDED FOR ANY PURPOSE, NOTIFY LAB WITHIN 5 DAYS. LOWEST DETECTABLE LIMITS FOR URINE DRUG SCREEN Drug Class                     Cutoff (ng/mL) Amphetamine and metabolites    1000 Barbiturate and metabolites    200 Benzodiazepine                 200 Tricyclics and metabolites     300 Opiates and metabolites        300 Cocaine and metabolites        300 THC                            50 Performed at [facility], [address] [place], [place] [address]   Troponin I (High Sensitivity)     Status: Abnormal   Collection Time: 11/21/18  9:06 PM  Result Value Ref Range   Troponin I (High Sensitivity) 28 (H) <18 ng/L    Comment: (NOTE) Elevated high sensitivity  troponin I (hsTnI) values and significant  changes across serial measurements may suggest ACS but many other  chronic and acute conditions are known to elevate hsTnI results.  Refer to the "Links" section for chest pain algorithms and additional  guidance. Performed at [facility], [address], [place], [place] [address]   Hemoglobin A1c     Status: Abnormal   Collection Time: 11/21/18 10:38 PM  Result Value Ref Range   Hgb A1c MFr Bld 11.0 (H) 4.8 - 5.6 %    Comment: (NOTE) Pre diabetes:          5.7%-6.4% Diabetes:              >  6.4% Glycemic control for   <7.0% adults with diabetes    Mean Plasma Glucose 269 mg/dL    Comment: Performed at [facility], [address] [place], [place] [address]  CBG monitoring, ED     Status: Abnormal   Collection Time: 11/21/18 11:13 PM  Result Value Ref Range   Glucose-Capillary 149 (H) 70 - 99 mg/dL  Troponin I (High Sensitivity)     Status: Abnormal   Collection Time: 11/21/18 11:22 PM  Result Value Ref Range   Troponin I (High Sensitivity) 27 (H) <18 ng/L    Comment: (NOTE) Elevated high sensitivity troponin I (hsTnI) values and significant  changes across serial measurements may suggest ACS but many other  chronic and acute conditions are known to elevate hsTnI results.  Refer to the "Links" section for chest pain algorithms and additional  guidance. Performed at [facility] Lab, [address] [place], [place] [address]   CBG monitoring, ED     Status: Abnormal   Collection Time: 11/22/18 12:50 AM  Result Value Ref Range   Glucose-Capillary 132 (H) 70 - 99 mg/dL  Urinalysis, Routine w reflex microscopic     Status: Abnormal   Collection Time: 11/22/18  1:07 AM  Result Value Ref Range   Color, Urine YELLOW YELLOW   APPearance CLEAR CLEAR   Specific Gravity, Urine >1.046 (H) 1.005 - 1.030   pH 5.0 5.0 - 8.0   Glucose, UA NEGATIVE NEGATIVE mg/dL   Hgb urine dipstick SMALL (A) NEGATIVE   Bilirubin Urine  NEGATIVE NEGATIVE   Ketones, ur 20 (A) NEGATIVE mg/dL   Protein, ur 30 (A) NEGATIVE mg/dL   Nitrite NEGATIVE NEGATIVE   Leukocytes,Ua NEGATIVE NEGATIVE   RBC / HPF 0-5 0 - 5 RBC/hpf   WBC, UA 0-5 0 - 5 WBC/hpf   Bacteria, UA FEW (A) NONE SEEN   Squamous Epithelial / LPF 0-5 0 - 5    Comment: Performed at [facility], [address], [place], [place] [address]  Procalcitonin - Baseline     Status: None   Collection Time: 11/22/18  2:56 AM  Result Value Ref Range   Procalcitonin <0.10 ng/mL    Comment:        Interpretation: PCT (Procalcitonin) <= 0.5 ng/mL: Systemic infection (sepsis) is not likely. Local bacterial infection is possible. REPEATED TO VERIFY (NOTE)       Sepsis PCT Algorithm           Lower Respiratory Tract                                      Infection PCT Algorithm    ----------------------------     ----------------------------         PCT < 0.25 ng/mL                PCT < 0.10 ng/mL         Strongly encourage             Strongly discourage   discontinuation of antibiotics    initiation of antibiotics    ----------------------------     -----------------------------       PCT 0.25 - 0.50 ng/mL            PCT 0.10 - 0.25 ng/mL               OR       >  80% decrease in PCT            Discourage initiation of                                            antibiotics      Encourage discontinuation           of antibiotics    ----------------------------     -----------------------------         PCT >= 0.50 ng/mL              PCT 0.26 - 0.50 ng/mL                AND       <80% decrease in PCT             Encourage initiation of                                             antibiotics       Encourage continuation           of antibiotics    ----------------------------     -----------------------------        PCT >= 0.50 ng/mL                  PCT > 0.50 ng/mL               AND         increase in PCT                  Strongly encourage                                       initiation of antibiotics    Strongly encourage escalation           of antibiotics                                     -----------------------------                                           PCT <= 0.25 ng/mL                                                 OR                                        > 80% decrease in PCT                                     Discontinue / Do not initiate  antibiotics Performed at [facility], [address] [place], [place] [address]   CBC     Status: None   Collection Time: 11/22/18  2:56 AM  Result Value Ref Range   WBC 7.8 4.0 - 10.5 K/uL   RBC 4.66 3.87 - 5.11 MIL/uL   Hemoglobin 13.8 12.0 - 15.0 g/dL   HCT 60.441.3 54.036.0 - 98.146.0 %   MCV 88.6 80.0 - 100.0 fL   MCH 29.6 26.0 - 34.0 pg   MCHC 33.4 30.0 - 36.0 g/dL   RDW 19.113.3 47.811.5 - 29.515.5 %   Platelets 228 150 - 400 K/uL   nRBC 0.0 0.0 - 0.2 %    Comment: Performed at [facility], [address] [place], [place] [address]  Comprehensive metabolic panel     Status: Abnormal   Collection Time: 11/22/18  2:56 AM  Result Value Ref Range   Sodium 142 135 - 145 mmol/L   Potassium 3.4 (L) 3.5 - 5.1 mmol/L    Comment: DELTA CHECK NOTED   Chloride 105 98 - 111 mmol/L   CO2 23 22 - 32 mmol/L   Glucose, Bld 122 (H) 70 - 99 mg/dL   BUN 10 8 - 23 mg/dL   Creatinine, Ser 8.650.68 0.44 - 1.00 mg/dL   Calcium 8.9 8.9 - 78.410.3 mg/dL   Total Protein 6.2 (L) 6.5 - 8.1 g/dL   Albumin 2.9 (L) 3.5 - 5.0 g/dL   AST 56 (H) 15 - 41 U/L   ALT 31 0 - 44 U/L   Alkaline Phosphatase 126 38 - 126 U/L   Total Bilirubin 1.0 0.3 - 1.2 mg/dL   GFR calc non Af Amer >60 >60 mL/min   GFR calc Af Amer >60 >60 mL/min   Anion gap 14 5 - 15    Comment: Performed at [facility], [address] [place], [place] [address]  Hemoglobin A1c     Status: Abnormal   Collection Time: 11/22/18  2:56 AM  Result Value Ref Range   Hgb A1c MFr Bld 11.1 (H) 4.8 - 5.6 %     Comment: (NOTE) Pre diabetes:          5.7%-6.4% Diabetes:              >6.4% Glycemic control for   <7.0% adults with diabetes    Mean Plasma Glucose 271.87 mg/dL    Comment: Performed at [facility], [address] [place], [place] [address]  Lipid panel     Status: Abnormal   Collection Time: 11/22/18  2:57 AM  Result Value Ref Range   Cholesterol 150 0 - 200 mg/dL   Triglycerides 324128 <401<150 mg/dL   HDL 39 (L) >02>40 mg/dL   Total CHOL/HDL Ratio 3.8 RATIO   VLDL 26 0 - 40 mg/dL   LDL Cholesterol 85 0 - 99 mg/dL    Comment:        Total Cholesterol/HDL:CHD Risk Coronary Heart Disease Risk Table                     Men   Women  1/2 Average Risk   3.4   3.3  Average Risk       5.0   4.4  2 X Average Risk   9.6   7.1  3 X Average Risk  23.4   11.0        Use the calculated Patient Ratio above and the CHD  Risk Table to determine the patient's CHD Risk.        ATP III CLASSIFICATION (LDL):  <100     mg/dL   Optimal  604-540  mg/dL   Near or Above                    Optimal  130-159  mg/dL   Borderline  981-191  mg/dL   High  >478     mg/dL   Very High Performed at [facility], [address], [place], [place] [address]   CBG monitoring, ED     Status: Abnormal   Collection Time: 11/22/18  4:39 AM  Result Value Ref Range   Glucose-Capillary 154 (H) 70 - 99 mg/dL   Ct Angio Head W Or Wo Contrast  Result Date: 11/21/2018 CLINICAL DATA:  Focal neuro deficit greater than 6 hours. Right-sided weakness and aphasia. EXAM: CT ANGIOGRAPHY HEAD AND NECK CT PERFUSION BRAIN TECHNIQUE: Multidetector CT imaging of the head and neck was performed using the standard protocol during bolus administration of intravenous contrast. Multiplanar CT image reconstructions and MIPs were obtained to evaluate the vascular anatomy. Carotid stenosis measurements (when applicable) are obtained utilizing NASCET criteria, using the distal internal carotid diameter as the denominator. Multiphase CT  imaging of the brain was performed following IV bolus contrast injection. Subsequent parametric perfusion maps were calculated using RAPID software. CONTRAST:  OMNIPAQUE IOHEXOL 350 MG/ML SOLN COMPARISON:  CT head 11/21/2018 FINDINGS: CTA NECK FINDINGS Aortic arch: Standard branching. Imaged portion shows no evidence of aneurysm or dissection. No significant stenosis of the major arch vessel origins. Right carotid system: No significant right carotid stenosis. Small calcific plaque at the origin of the right external carotid artery. Left carotid system: Mild atherosclerotic disease left carotid bifurcation. No significant left carotid stenosis. Vertebral arteries: Both vertebral arteries patent to the basilar without significant stenosis. Skeleton: Mild degenerative changes in the cervical spine. No acute skeletal abnormality. Other neck: Negative for mass or adenopathy. Upper chest: Left-sided pacemaker. Lung apices clear bilaterally. Review of the MIP images confirms the above findings CTA HEAD FINDINGS Anterior circulation: Cavernous carotid widely patent bilaterally without significant calcification or stenosis. Anterior and middle cerebral arteries widely patent bilaterally. Posterior circulation: Both vertebral arteries patent to the basilar. PICA patent bilaterally. Basilar widely patent. Superior cerebellar and posterior cerebral arteries patent bilaterally. Venous sinuses: Minimal venous contrast due to timing of the scan Anatomic variants: None Review of the MIP images confirms the above findings CT Brain Perfusion Findings: ASPECTS: 10 CBF (<30%) Volume: 0mL Perfusion (Tmax>6.0s) volume: 0mL Mismatch Volume: 0mL Infarction Location:None IMPRESSION: 1. CT perfusion negative for acute ischemia or infarct.  Aspect 10 2. No significant carotid or vertebral artery stenosis in the neck. 3. No significant cranial stenosis or large vessel occlusion. Electronically Signed   By: Marlan Palau M.D.   On:  11/21/2018 18:54   Ct Angio Neck W Or Wo Contrast  Result Date: 11/21/2018 CLINICAL DATA:  Focal neuro deficit greater than 6 hours. Right-sided weakness and aphasia. EXAM: CT ANGIOGRAPHY HEAD AND NECK CT PERFUSION BRAIN TECHNIQUE: Multidetector CT imaging of the head and neck was performed using the standard protocol during bolus administration of intravenous contrast. Multiplanar CT image reconstructions and MIPs were obtained to evaluate the vascular anatomy. Carotid stenosis measurements (when applicable) are obtained utilizing NASCET criteria, using the distal internal carotid diameter as the denominator. Multiphase CT imaging of the brain was performed following IV bolus contrast injection.  Subsequent parametric perfusion maps were calculated using RAPID software. CONTRAST:  OMNIPAQUE IOHEXOL 350 MG/ML SOLN COMPARISON:  CT head 11/21/2018 FINDINGS: CTA NECK FINDINGS Aortic arch: Standard branching. Imaged portion shows no evidence of aneurysm or dissection. No significant stenosis of the major arch vessel origins. Right carotid system: No significant right carotid stenosis. Small calcific plaque at the origin of the right external carotid artery. Left carotid system: Mild atherosclerotic disease left carotid bifurcation. No significant left carotid stenosis. Vertebral arteries: Both vertebral arteries patent to the basilar without significant stenosis. Skeleton: Mild degenerative changes in the cervical spine. No acute skeletal abnormality. Other neck: Negative for mass or adenopathy. Upper chest: Left-sided pacemaker. Lung apices clear bilaterally. Review of the MIP images confirms the above findings CTA HEAD FINDINGS Anterior circulation: Cavernous carotid widely patent bilaterally without significant calcification or stenosis. Anterior and middle cerebral arteries widely patent bilaterally. Posterior circulation: Both vertebral arteries patent to the basilar. PICA patent bilaterally. Basilar  widely patent. Superior cerebellar and posterior cerebral arteries patent bilaterally. Venous sinuses: Minimal venous contrast due to timing of the scan Anatomic variants: None Review of the MIP images confirms the above findings CT Brain Perfusion Findings: ASPECTS: 10 CBF (<30%) Volume: 0mL Perfusion (Tmax>6.0s) volume: 0mL Mismatch Volume: 0mL Infarction Location:None IMPRESSION: 1. CT perfusion negative for acute ischemia or infarct.  Aspect 10 2. No significant carotid or vertebral artery stenosis in the neck. 3. No significant cranial stenosis or large vessel occlusion. Electronically Signed   By: Marlan Palau M.D.   On: 11/21/2018 18:54   Ct C-spine No Charge  Result Date: 11/21/2018 CLINICAL DATA:  63 year old female code stroke presentation today. Right side weakness. EXAM: CT CERVICAL SPINE WITH CONTRAST TECHNIQUE: Multiplanar CT images of the cervical spine were reconstructed from contemporary CTA of the Neck. CONTRAST:  No additional COMPARISON:  CT head, CTA head and neck, and CT perfusion earlier today. FINDINGS: Alignment: Straightening of cervical lordosis. Cervicothoracic junction alignment is within normal limits. Bilateral posterior element alignment is within normal limits. Skull base and vertebrae: Mild motion degradation at C2. Visualized skull base is intact. No atlanto-occipital dissociation. No acute osseous abnormality identified. Soft tissues and spinal canal: No prevertebral fluid or swelling. No visible canal hematoma. Neck soft tissues reported with the CTA today separately. Disc levels: Widespread cervical spine degeneration. There is ankylosis of the C2-C3 posterior elements on the right. There is advanced disc and endplate degeneration C4-C5 through C6-C7. 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 Subsequently, there is mild and up to moderate multilevel cervical spinal stenosis, likely maximal at C5-C6. Upper chest: Upper thoracic levels appear intact. Other chest  findings reported on the CTA today separately. IMPRESSION: 1. No acute osseous abnormality in the cervical spine. 2. 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 Degenerative cervical spinal stenosis from C4-C5 to C6-C7, up to moderate at C5-C6. 3. CTA head and neck today reported separately. Electronically Signed   By: Odessa Fleming M.D.   On: 11/21/2018 21:00   Ct Cerebral Perfusion W Contrast  Result Date: 11/21/2018 CLINICAL DATA:  Focal neuro deficit greater than 6 hours. Right-sided weakness and aphasia. EXAM: CT ANGIOGRAPHY HEAD AND NECK CT PERFUSION BRAIN TECHNIQUE: Multidetector CT imaging of the head and neck was performed using the standard protocol during bolus administration of intravenous contrast. Multiplanar CT image reconstructions and MIPs were obtained to evaluate the vascular anatomy. Carotid stenosis measurements (when applicable) are obtained utilizing NASCET criteria, using the distal internal  carotid diameter as the denominator. Multiphase CT imaging of the brain was performed following IV bolus contrast injection. Subsequent parametric perfusion maps were calculated using RAPID software. CONTRAST:  OMNIPAQUE IOHEXOL 350 MG/ML SOLN COMPARISON:  CT head 11/21/2018 FINDINGS: CTA NECK FINDINGS Aortic arch: Standard branching. Imaged portion shows no evidence of aneurysm or dissection. No significant stenosis of the major arch vessel origins. Right carotid system: No significant right carotid stenosis. Small calcific plaque at the origin of the right external carotid artery. Left carotid system: Mild atherosclerotic disease left carotid bifurcation. No significant left carotid stenosis. Vertebral arteries: Both vertebral arteries patent to the basilar without significant stenosis. Skeleton: Mild degenerative changes in the cervical spine. No acute skeletal abnormality. Other neck: Negative for mass or adenopathy. Upper chest:  Left-sided pacemaker. Lung apices clear bilaterally. Review of the MIP images confirms the above findings CTA HEAD FINDINGS Anterior circulation: Cavernous carotid widely patent bilaterally without significant calcification or stenosis. Anterior and middle cerebral arteries widely patent bilaterally. Posterior circulation: Both vertebral arteries patent to the basilar. PICA patent bilaterally. Basilar widely patent. Superior cerebellar and posterior cerebral arteries patent bilaterally. Venous sinuses: Minimal venous contrast due to timing of the scan Anatomic variants: None Review of the MIP images confirms the above findings CT Brain Perfusion Findings: ASPECTS: 10 CBF (<30%) Volume: 86mL Perfusion (Tmax>6.0s) volume: 26mL Mismatch Volume: 32mL Infarction Location:None IMPRESSION: 1. CT perfusion negative for acute ischemia or infarct.  Aspect 10 2. No significant carotid or vertebral artery stenosis in the neck. 3. No significant cranial stenosis or large vessel occlusion. Electronically Signed   By: Marlan Palau M.D.   On: 11/21/2018 18:54   Dg Chest Port 1 View  Result Date: 11/22/2018 CLINICAL DATA:  Altered mental status, fever EXAM: PORTABLE CHEST 1 VIEW COMPARISON:  Radiograph August 19, 2018 FINDINGS: Dual lead pacer/defibrillator pack overlies the left chest wall with leads at the right atrium and cardiac apex. No consolidation, features of edema, pneumothorax, or effusion. Pulmonary vascularity is normally distributed. The cardiomediastinal contours are unremarkable. No acute osseous or soft tissue abnormality. IMPRESSION: No acute cardiopulmonary abnormality. Electronically Signed   By: Kreg Shropshire M.D.   On: 11/22/2018 01:49   Ct Head Code Stroke Wo Contrast  Result Date: 11/21/2018 CLINICAL DATA:  Code stroke. Focal neuro deficit greater than 6 hours. Rule out stroke. EXAM: CT HEAD WITHOUT CONTRAST TECHNIQUE: Contiguous axial images were obtained from the base of the skull through the vertex  without intravenous contrast. COMPARISON:  None. FINDINGS: Brain: Mild atrophy. Negative for acute infarct, hemorrhage, mass. Mild white matter changes appear chronic. Vascular: Negative for hyperdense vessel Skull: Negative Sinuses/Orbits: Negative Other: None ASPECTS ([place]berta Stroke Program Early CT Score) - Ganglionic level infarction (caudate, lentiform nuclei, internal capsule, insula, M1-M3 cortex): 7 - Supraganglionic infarction (M4-M6 cortex): 3 Total score (0-10 with 10 being normal): 10 IMPRESSION: 1. No acute abnormality 2. ASPECTS is 10 3. These results were called by telephone at the time of interpretation on 11/21/2018 at 6:35 pm to provider Amada Jupiter MD , who verbally acknowledged these results. Electronically Signed   By: Marlan Palau M.D.   On: 11/21/2018 18:37    Pending Labs Unresulted Labs (From admission, onward)    Start     Ordered   11/22/18 0606  Influenza panel by PCR (type A & B)  (Influenza PCR Panel)  ONCE - STAT,   STAT     11/22/18 0605   11/22/18 0510  Lactic acid, plasma  STAT  Now then every 3 hours,   R (with STAT occurrences)     11/22/18 0509   11/22/18 0500  HIV Antibody (routine testing w rflx)  (HIV Antibody (Routine testing w reflex) panel)  Tomorrow morning,   R     11/21/18 2222   11/22/18 0500  HIV4GL Save Tube  (HIV Antibody (Routine testing w reflex) panel)  Tomorrow morning,   R     11/21/18 2222   11/22/18 0116  Culture, blood (routine x 2)  BLOOD CULTURE X 2,   R (with STAT occurrences)     11/22/18 0115   11/22/18 0116  Culture, Urine  ONCE - STAT,   STAT     11/22/18 0115   11/21/18 2201  Vitamin B1  ONCE - STAT,   STAT     11/21/18 2201          Vitals/Pain Today's Vitals   11/22/18 0700 11/22/18 0730 11/22/18 0800 11/22/18 0852  BP: 97/64 114/64 105/65   Pulse: 87 85 82   Resp: 13 14 19    Temp:      TempSrc:      SpO2: 100% 100% 100%   Weight:      Height:      PainSc:    Asleep    Isolation Precautions Droplet  precaution  Medications Medications  mometasone-formoterol (DULERA) 200-5 MCG/ACT inhaler 2 puff (2 puffs Inhalation Not Given 11/21/18 2332)   stroke: mapping our early stages of recovery book (has no administration in time range)  0.9 %  sodium chloride infusion ( Intravenous Rate/Dose Change 11/22/18 0515)  acetaminophen (TYLENOL) tablet 650 mg ( Oral See Alternative 11/22/18 0106)    Or  acetaminophen (TYLENOL) solution 650 mg ( Per Tube See Alternative 11/22/18 0106)    Or  acetaminophen (TYLENOL) suppository 650 mg (650 mg Rectal Given 11/22/18 0106)  aspirin suppository 300 mg (has no administration in time range)    Or  aspirin tablet 325 mg (has no administration in time range)  insulin aspart (novoLOG) injection 0-9 Units (2 Units Subcutaneous Given 11/22/18 0452)  levothyroxine (SYNTHROID, LEVOTHROID) injection 137.5 mcg (has no administration in time range)  vancomycin (VANCOCIN) IVPB 1000 mg/200 mL premix (has no administration in time range)  acyclovir (ZOVIRAX) 815 mg in dextrose 5 % 150 mL IVPB (0 mg Intravenous Stopped 11/22/18 0430)  aztreonam (AZACTAM) 2 g in sodium chloride 0.9 % 100 mL IVPB (has no administration in time range)  sodium chloride flush (NS) 0.9 % injection 3 mL (3 mLs Intravenous Given 11/21/18 2319)  iohexol (OMNIPAQUE) 350 MG/ML injection 100 mL (100 mLs Intravenous Contrast Given 11/21/18 1828)  LORazepam (ATIVAN) injection 1 mg (1 mg Intravenous Given 11/21/18 2123)  vancomycin (VANCOCIN) 1,750 mg in sodium chloride 0.9 % 500 mL IVPB (0 mg Intravenous Stopped 11/22/18 0820)  famotidine (PEPCID) IVPB 20 mg premix (0 mg Intravenous Stopped 11/22/18 0533)  diphenhydrAMINE (BENADRYL) injection 50 mg (50 mg Intravenous Given 11/22/18 0459)    Mobility unknown High fall risk   Focused Assessments Neuro Assessment Handoff:  Swallow screen pass? No  Cardiac Rhythm: Ventricular paced NIH Stroke Scale ( + Modified Stroke Scale Criteria)  Interval: Initial Level  of Consciousness (1a.)   : Alert, keenly responsive LOC Questions (1b. )   +: Answers neither question correctly LOC Commands (1c. )   + : Performs neither task correctly Best Gaze (2. )  +: Normal Visual (3. )  +: No visual loss Facial Palsy (4. )    :  Minor paralysis Motor Arm, Left (5a. )   +: No drift Motor Arm, Right (5b. )   +: Drift Motor Leg, Left (6a. )   +: Drift Motor Leg, Right (6b. )   +: Drift Limb Ataxia (7. ): Absent Sensory (8. )   +: Normal, no sensory loss Best Language (9. )   +: No aphasia Dysarthria (10. ): Severe dysarthria, patient's speech is so slurred as to be unintelligible in the absence of or out of proportion to any dysphasia, or is mute/anarthric Extinction/Inattention (11.)   +: No Abnormality Modified SS Total  +: 7 Complete NIHSS TOTAL: 13 Last date known well: 11/19/18   Neuro Assessment: Exceptions to [facility] Neuro Checks:   Initial (11/21/18 1826)  Last Documented NIHSS Modified Score: 7 (11/22/18 1610) Has TPA been given? No If patient is a Neuro Trauma and patient is going to OR before floor call report to 4N Charge nurse: [phone] or [phone]     R Recommendations: See Admitting Provider Note  Report given to: Nita Sells, RN 3 [place]  Additional Notes:

## 2018-11-22 NOTE — Procedures (Signed)
Indication: Fever, AMS  Risks of the procedure were dicussed with the patient including post-LP headache, bleeding, infection, weakness/numbness of legs(radiculopathy), death.  The patient/patient's proxy agreed and written consent was obtained.   The patient was prepped and draped, and using sterile technique a 20 gauge quinke spinal needle was inserted in the L4-5 space. Unfortunately, despite multiple attempts, no CSF was obtained. Will request under fluoroscopy  Roland Rack, MD [facility] [phone]  If 7pm- 7am, please page neurology on call as listed in [place].

## 2018-11-22 NOTE — Progress Notes (Signed)
SLP Cancellation Note  Patient Details Name: Anna Rojas MRN: [id number] DOB: [birth year]   Cancelled treatment:       Reason Eval/Treat Not Completed: Fatigue/lethargy limiting ability to participate. Per RN pt is lethargic, not following commands, and about to have her EEG. She recommends holding eval for now. Will f/u as able - RN can also page SLP if pt becomes more alert later.    Venita Sheffield Daneshia Tavano 11/22/2018, 10:25 AM  Pollyann Glen, M.A. [place] Acute Environmental education officer [phone] Office [phone]

## 2018-11-22 NOTE — Progress Notes (Signed)
Pt arrived unit safely 

## 2018-11-22 NOTE — Progress Notes (Signed)
EEG Completed; Results Pending  

## 2018-11-22 NOTE — Progress Notes (Signed)
Subjective: Slightly better today  Exam: Vitals:   11/22/18 0730 11/22/18 0800  BP: 114/64 105/65  Pulse: 85 82  Resp: 14 19  Temp:    SpO2: 100% 100%   Gen: In bed, NAD Resp: non-labored breathing, no acute distress Abd: soft, nt  Neuro: MS: Awake, follows command to show thumbs, speech is still incomprehensible CN: Blinks to threat bilaterally, extraocular movements are intact Motor: She moves all extremities spontaneously, though she does not cooperate for formal strength testing Sensory: She response to noxious stimulation bilaterally  Pertinent Labs: CMP-unremarkable  Impression: 63 year old female with altered mental status of unclear etiology.  Though stroke was suspected initially, with a fever of 102, CNS infection has to be considered and she has been started on empiric coverage.  She will need lumbar puncture today.  Her ICD appears to be MRI safe, and therefore we will try to pursue MRI.  Recommendations: 1) lumbar puncture for cells, glucose, protein, HSV 2) MRI brain 3) EEG  Roland Rack, MD [facility] [phone]  If 7pm- 7am, please page neurology on call as listed in [place].

## 2018-11-22 NOTE — ED Notes (Signed)
Unable to wake pt up to assess in following commands portion of NIH

## 2018-11-22 NOTE — ED Notes (Signed)
Unable to wake pt up to assess following commands portion of NIH. Pt's rash also noted to greatly improve since Benadryl and Pepcid admin

## 2018-11-23 ENCOUNTER — Inpatient Hospital Stay (HOSPITAL_COMMUNITY): Payer: Medicaid Other

## 2018-11-23 LAB — GLUCOSE, CAPILLARY
Glucose-Capillary: 101 mg/dL — ABNORMAL HIGH (ref 70–99)
Glucose-Capillary: 102 mg/dL — ABNORMAL HIGH (ref 70–99)
Glucose-Capillary: 122 mg/dL — ABNORMAL HIGH (ref 70–99)
Glucose-Capillary: 142 mg/dL — ABNORMAL HIGH (ref 70–99)
Glucose-Capillary: 187 mg/dL — ABNORMAL HIGH (ref 70–99)
Glucose-Capillary: 95 mg/dL (ref 70–99)

## 2018-11-23 LAB — CSF CELL COUNT WITH DIFFERENTIAL
RBC Count, CSF: 1 /mm3 — ABNORMAL HIGH
Tube #: 3
WBC, CSF: 1 /mm3 (ref 0–5)

## 2018-11-23 LAB — COMPREHENSIVE METABOLIC PANEL
ALT: 26 U/L (ref 0–44)
AST: 38 U/L (ref 15–41)
Albumin: 2.7 g/dL — ABNORMAL LOW (ref 3.5–5.0)
Alkaline Phosphatase: 96 U/L (ref 38–126)
Anion gap: 10 (ref 5–15)
BUN: 6 mg/dL — ABNORMAL LOW (ref 8–23)
CO2: 26 mmol/L (ref 22–32)
Calcium: 8.6 mg/dL — ABNORMAL LOW (ref 8.9–10.3)
Chloride: 107 mmol/L (ref 98–111)
Creatinine, Ser: 0.5 mg/dL (ref 0.44–1.00)
GFR calc Af Amer: >60 mL/min (ref >60)
GFR calc non Af Amer: >60 mL/min (ref >60)
Glucose, Bld: 117 mg/dL — ABNORMAL HIGH (ref 70–99)
Potassium: 3 mmol/L — ABNORMAL LOW (ref 3.5–5.1)
Sodium: 143 mmol/L (ref 135–145)
Total Bilirubin: 0.7 mg/dL (ref 0.3–1.2)
Total Protein: 6 g/dL — ABNORMAL LOW (ref 6.5–8.1)

## 2018-11-23 LAB — T3, FREE: T3, Free: 1.7 pg/mL — ABNORMAL LOW (ref 2.0–4.4)

## 2018-11-23 LAB — PROTEIN, CSF: Total  Protein, CSF: 63 mg/dL — ABNORMAL HIGH (ref 15–45)

## 2018-11-23 LAB — GLUCOSE, CSF: Glucose, CSF: 74 mg/dL — ABNORMAL HIGH (ref 40–70)

## 2018-11-23 MED ORDER — SODIUM CHLORIDE 0.9 % IV SOLN
INTRAVENOUS | Status: DC | PRN
  Administered 2018-11-23 (×2): 250 mL via INTRAVENOUS

## 2018-11-23 MED ORDER — KCL IN DEXTROSE-NACL 40-5-0.45 MEQ/L-%-% IV SOLN
INTRAVENOUS | Status: DC
  Administered 2018-11-23 – 2018-11-24 (×2): via INTRAVENOUS
  Filled 2018-11-23 (×3): qty 1000

## 2018-11-23 MED ORDER — VANCOMYCIN HCL IN DEXTROSE 1-5 GM/200ML-% IV SOLN
1000.0000 mg | Freq: Two times a day (BID) | INTRAVENOUS | Status: DC
  Administered 2018-11-23 – 2018-11-24 (×2): 1000 mg via INTRAVENOUS
  Filled 2018-11-23 (×3): qty 200

## 2018-11-23 MED ORDER — DEXTROSE-NACL 5-0.45 % IV SOLN: INTRAVENOUS | Status: DC

## 2018-11-23 MED ORDER — LORAZEPAM 2 MG/ML IJ SOLN
0.5000 mg | Freq: Once | INTRAMUSCULAR | Status: AC
  Administered 2018-11-23: 0.5 mg via INTRAVENOUS
  Filled 2018-11-23: qty 1

## 2018-11-23 NOTE — Progress Notes (Signed)
PT Cancellation Note  Patient Details Name: Anna Rojas MRN: [id number] DOB: [birth year]   Cancelled Treatment:    Reason Eval/Treat Not Completed: Medical issues which prohibited therapy. Per RN pt too confused to attempt PT. Plan for LP under fluoroscopy at some point today. PT will continue to f/u with pt acutely as available and appropriate.    [place] 11/23/2018, 10:00 AM

## 2018-11-23 NOTE — Progress Notes (Signed)
SLP Cancellation Note  Patient Details Name: Anna Rojas MRN: [id number] DOB: 11/15/1955   Cancelled treatment:       Reason Eval/Treat Not Completed: Patient not medically ready;Patient's level of consciousness. Spoke with patient's RN who stated that although she is more alert then yesterday, she is still very lethargic and delirious. Will check patient's readiness tomorrow.    Nadara Mode Tarrell 11/23/2018, 10:57 AM  Sonia Baller, MA, CCC-SLP Speech Therapy [facility] Acute Rehab Pager: [phone]

## 2018-11-23 NOTE — Progress Notes (Signed)
PROGRESS NOTE    Anna Rojas  WLN:[id number] DOB: 03/22/1955 DOA: 11/21/2018 PCP: Patient, No Pcp Per   Brief Narrative: Anna Rojas is a 63 y.o. female with history of torsades status post ICD placement, history of complete heart block status post pacemaker placement, diabetes mellitus, hypothyroidism. Patient presented secondary to confusion with unknown etiology. Obtunded.   Assessment & Plan:   Principal Problem:   Acute encephalopathy Active Problems:   Uncontrolled type 2 diabetes mellitus with hyperglycemia (HCC)   Hypothyroidism   Acute metabolic encephalopathy   Acute encephalopathy Unknown etiology. Concern for viral etiology with fevers, including meningitis. MRI performed and significant for generalized atrophy. LP performed but unable to obtain fluid. EEG non-specific -Neurology recommendations: LP -Vancomycin, Acyclovir, Aztreonam -D5 1/2 NS fluids  Hypokalemia -Add potassium to fluids  Fever Unknown etiology but possibly related to mental status change. procalcitonin is undetectable. No leukocytosis.  Diabetes mellitus, type 2 -Continue SSI q4 hours  Hypothyroidism Patient's TSH is <0.01 on admission. She takes Synthroid 300 mcg as an outpatient. Elevated free T4 with low free T3. -Hold Synthroid for now. Held since 10/2  History of torsades Patient is s/p ICD.   DVT prophylaxis: SCDs Code Status:   Code Status: Full Code Family Communication: None at bedside Disposition Plan: Discharge pending clinical improvement   Consultants:   Neurology  Procedures:   None  Antimicrobials:  Vancomycin  Aztreonam  Acyclovir  Ceftriaxone   Subjective: Patient unable to give me histor  Objective: Vitals:   11/22/18 2039 11/23/18 0104 11/23/18 0400 11/23/18 0700  BP: (!) 165/128 (!) 135/122 (!) 175/154 (!) 112/93  Pulse: (!) 114 (!) 105 69 97  Resp: 18 19 18    Temp: 98 F (36.7 C) 98.8 F (37.1 C) 97.7 F (36.5 C) 98 F (36.7 C)    TempSrc: Oral Oral Oral Axillary  SpO2:  93% 92%   Weight:      Height:        Intake/Output Summary (Last 24 hours) at 11/23/2018 1142 Last data filed at 11/23/2018 0411 Gross per 24 hour  Intake 1803.49 ml  Output 500 ml  Net 1303.49 ml   Filed Weights   11/22/18 0230  Weight: 81.6 kg    Examination:  General exam: Appears calm and comfortable Respiratory system: Clear to auscultation. Respiratory effort normal. Cardiovascular system: S1 & S2 heard, RRR. No murmurs, rubs, gallops or clicks. Gastrointestinal system: Abdomen is nondistended, soft and nontender. No organomegaly or masses felt. Normal bowel sounds heard. Central nervous system: Alert and disorientedoriented. Extremities: No edema. No calf tenderness Skin: No cyanosis. No rashes Psychiatry: Delirious     Data Reviewed: I have personally reviewed following labs and imaging studies  CBC: Recent Labs  Lab 11/21/18 1804 11/21/18 1811 11/22/18 0256  WBC 8.9  --  7.8  NEUTROABS 6.6  --   --   HGB 14.9 15.0 13.8  HCT 44.4 44.0 41.3  MCV 87.9  --  88.6  PLT 224  --  740   Basic Metabolic Panel: Recent Labs  Lab 11/21/18 1804 11/21/18 1811 11/22/18 0256 11/23/18 1033  NA 140 139 142 143  K 4.5 4.2 3.4* 3.0*  CL 105 105 105 107  CO2 24  --  23 26  GLUCOSE 162* 153* 122* 117*  BUN 9 11 10  6*  CREATININE 0.64 0.50 0.68 0.50  CALCIUM 8.9  --  8.9 8.6*   GFR: Estimated Creatinine Clearance: 70.6 mL/min (by C-G formula based on SCr of 0.5  mg/dL). Liver Function Tests: Recent Labs  Lab 11/21/18 1804 11/22/18 0256 11/23/18 1033  AST 77* 56* 38  ALT 36 31 26  ALKPHOS 142* 126 96  BILITOT 1.1 1.0 0.7  PROT 6.4* 6.2* 6.0*  ALBUMIN 3.0* 2.9* 2.7*   No results for input(s): LIPASE, AMYLASE in the last 168 hours. Recent Labs  Lab 11/21/18 1920  AMMONIA 10   Coagulation Profile: Recent Labs  Lab 11/21/18 1804  INR 1.2   Cardiac Enzymes: No results for input(s): CKTOTAL, CKMB, CKMBINDEX,  TROPONINI in the last 168 hours. BNP (last 3 results) No results for input(s): PROBNP in the last 8760 hours. HbA1C: Recent Labs    11/21/18 2238 11/22/18 0256  HGBA1C 11.0* 11.1*   CBG: Recent Labs  Lab 11/22/18 1708 11/22/18 2153 11/23/18 0100 11/23/18 0306 11/23/18 0738  GLUCAP 95 119* 102* 95 142*   Lipid Profile: Recent Labs    11/22/18 0257  CHOL 150  HDL 39*  LDLCALC 85  TRIG 161128  CHOLHDL 3.8   Thyroid Function Tests: Recent Labs    11/21/18 1920 11/22/18 1748  TSH <0.010*  --   FREET4  --  1.37*  T3FREE  --  1.7*   Anemia Panel: No results for input(s): VITAMINB12, FOLATE, FERRITIN, TIBC, IRON, RETICCTPCT in the last 72 hours. Sepsis Labs: Recent Labs  Lab 11/22/18 0256 11/22/18 1027  PROCALCITON <0.10  --   LATICACIDVEN  --  0.8    Recent Results (from the past 240 hour(s))  SARS Coronavirus 2 [facility](Hospital order, Performed in [facility] hospital lab) Nasopharyngeal Nasopharyngeal Swab     Status: None   Collection Time: 11/21/18  6:40 PM   Specimen: Nasopharyngeal Swab  Result Value Ref Range Status   SARS Coronavirus 2 NEGATIVE NEGATIVE Final    Comment: (NOTE) If result is NEGATIVE SARS-CoV-2 target nucleic acids are NOT DETECTED. The SARS-CoV-2 RNA is generally detectable in upper and lower  respiratory specimens during the acute phase of infection. The lowest  concentration of SARS-CoV-2 viral copies this assay can detect is 250  copies / mL. A negative result does not preclude SARS-CoV-2 infection  and should not be used as the sole basis for treatment or other  patient management decisions.  A negative result may occur with  improper specimen collection / handling, submission of specimen other  than nasopharyngeal swab, presence of viral mutation(s) within the  areas targeted by this assay, and inadequate number of viral copies  (<250 copies / mL). A negative result must be combined with clinical  observations, patient history, and  epidemiological information. If result is POSITIVE SARS-CoV-2 target nucleic acids are DETECTED. The SARS-CoV-2 RNA is generally detectable in upper and lower  respiratory specimens dur ing the acute phase of infection.  Positive  results are indicative of active infection with SARS-CoV-2.  Clinical  correlation with patient history and other diagnostic information is  necessary to determine patient infection status.  Positive results do  not rule out bacterial infection or co-infection with other viruses. If result is PRESUMPTIVE POSTIVE SARS-CoV-2 nucleic acids MAY BE PRESENT.   A presumptive positive result was obtained on the submitted specimen  and confirmed on repeat testing.  While 2019 novel coronavirus  (SARS-CoV-2) nucleic acids may be present in the submitted sample  additional confirmatory testing may be necessary for epidemiological  and / or clinical management purposes  to differentiate between  SARS-CoV-2 and other Sarbecovirus currently known to infect humans.  If clinically indicated additional  testing with an alternate test  methodology [id number]) is advised. The SARS-CoV-2 RNA is generally  detectable in upper and lower respiratory sp ecimens during the acute  phase of infection. The expected result is Negative. Fact Sheet for Patients:  BoilerBrush.com.cy Fact Sheet for Healthcare Providers: https://pope.com/ This test is not yet approved or cleared by the [place] FDA and has been authorized for detection and/or diagnosis of SARS-CoV-2 by FDA under an Emergency Use Authorization (EUA).  This EUA will remain in effect (meaning this test can be used) for the duration of the COVID-19 declaration under Section 564(b)(1) of the Act, 21 U.S.C. section 360bbb-3(b)(1), unless the authorization is terminated or revoked sooner. Performed at [facility], [address], [place], [place] [address]   Culture,  Urine     Status: Abnormal   Collection Time: 11/22/18  1:16 AM   Specimen: Urine, Random  Result Value Ref Range Status   Specimen Description URINE, RANDOM  Final   Special Requests   Final    NONE Performed at [facility], [address], [place], [place] [address]    Culture MULTIPLE SPECIES PRESENT, SUGGEST RECOLLECTION (A)  Final   Report Status 11/22/2018 FINAL  Final         Radiology Studies: Ct Angio Head W Or Wo Contrast  Result Date: 11/21/2018 CLINICAL DATA:  Focal neuro deficit greater than 6 hours. Right-sided weakness and aphasia. EXAM: CT ANGIOGRAPHY HEAD AND NECK CT PERFUSION BRAIN TECHNIQUE: Multidetector CT imaging of the head and neck was performed using the standard protocol during bolus administration of intravenous contrast. Multiplanar CT image reconstructions and MIPs were obtained to evaluate the vascular anatomy. Carotid stenosis measurements (when applicable) are obtained utilizing NASCET criteria, using the distal internal carotid diameter as the denominator. Multiphase CT imaging of the brain was performed following IV bolus contrast injection. Subsequent parametric perfusion maps were calculated using RAPID software. CONTRAST:  OMNIPAQUE IOHEXOL 350 MG/ML SOLN COMPARISON:  CT head 11/21/2018 FINDINGS: CTA NECK FINDINGS Aortic arch: Standard branching. Imaged portion shows no evidence of aneurysm or dissection. No significant stenosis of the major arch vessel origins. Right carotid system: No significant right carotid stenosis. Small calcific plaque at the origin of the right external carotid artery. Left carotid system: Mild atherosclerotic disease left carotid bifurcation. No significant left carotid stenosis. Vertebral arteries: Both vertebral arteries patent to the basilar without significant stenosis. Skeleton: Mild degenerative changes in the cervical spine. No acute skeletal abnormality. Other neck: Negative for mass or adenopathy. Upper chest:  Left-sided pacemaker. Lung apices clear bilaterally. Review of the MIP images confirms the above findings CTA HEAD FINDINGS Anterior circulation: Cavernous carotid widely patent bilaterally without significant calcification or stenosis. Anterior and middle cerebral arteries widely patent bilaterally. Posterior circulation: Both vertebral arteries patent to the basilar. PICA patent bilaterally. Basilar widely patent. Superior cerebellar and posterior cerebral arteries patent bilaterally. Venous sinuses: Minimal venous contrast due to timing of the scan Anatomic variants: None Review of the MIP images confirms the above findings CT Brain Perfusion Findings: ASPECTS: 10 CBF (<30%) Volume: 0mL Perfusion (Tmax>6.0s) volume: 0mL Mismatch Volume: 0mL Infarction Location:None IMPRESSION: 1. CT perfusion negative for acute ischemia or infarct.  Aspect 10 2. No significant carotid or vertebral artery stenosis in the neck. 3. No significant cranial stenosis or large vessel occlusion. Electronically Signed   By: Marlan Palau M.D.   On: 11/21/2018 18:54   Ct Angio Neck W Or Wo Contrast  Result Date:  11/21/2018 CLINICAL DATA:  Focal neuro deficit greater than 6 hours. Right-sided weakness and aphasia. EXAM: CT ANGIOGRAPHY HEAD AND NECK CT PERFUSION BRAIN TECHNIQUE: Multidetector CT imaging of the head and neck was performed using the standard protocol during bolus administration of intravenous contrast. Multiplanar CT image reconstructions and MIPs were obtained to evaluate the vascular anatomy. Carotid stenosis measurements (when applicable) are obtained utilizing NASCET criteria, using the distal internal carotid diameter as the denominator. Multiphase CT imaging of the brain was performed following IV bolus contrast injection. Subsequent parametric perfusion maps were calculated using RAPID software. CONTRAST:  OMNIPAQUE IOHEXOL 350 MG/ML SOLN COMPARISON:  CT head 11/21/2018 FINDINGS: CTA NECK FINDINGS Aortic arch:  Standard branching. Imaged portion shows no evidence of aneurysm or dissection. No significant stenosis of the major arch vessel origins. Right carotid system: No significant right carotid stenosis. Small calcific plaque at the origin of the right external carotid artery. Left carotid system: Mild atherosclerotic disease left carotid bifurcation. No significant left carotid stenosis. Vertebral arteries: Both vertebral arteries patent to the basilar without significant stenosis. Skeleton: Mild degenerative changes in the cervical spine. No acute skeletal abnormality. Other neck: Negative for mass or adenopathy. Upper chest: Left-sided pacemaker. Lung apices clear bilaterally. Review of the MIP images confirms the above findings CTA HEAD FINDINGS Anterior circulation: Cavernous carotid widely patent bilaterally without significant calcification or stenosis. Anterior and middle cerebral arteries widely patent bilaterally. Posterior circulation: Both vertebral arteries patent to the basilar. PICA patent bilaterally. Basilar widely patent. Superior cerebellar and posterior cerebral arteries patent bilaterally. Venous sinuses: Minimal venous contrast due to timing of the scan Anatomic variants: None Review of the MIP images confirms the above findings CT Brain Perfusion Findings: ASPECTS: 10 CBF (<30%) Volume: 12mL Perfusion (Tmax>6.0s) volume: 53mL Mismatch Volume: 6mL Infarction Location:None IMPRESSION: 1. CT perfusion negative for acute ischemia or infarct.  Aspect 10 2. No significant carotid or vertebral artery stenosis in the neck. 3. No significant cranial stenosis or large vessel occlusion. Electronically Signed   By: Marlan Palau M.D.   On: 11/21/2018 18:54   Mr Laqueta Jean YT Contrast  Result Date: 11/23/2018 CLINICAL DATA:  Altered mental status. Encephalopathy. EXAM: MRI HEAD WITHOUT AND WITH CONTRAST TECHNIQUE: Multiplanar, multiecho pulse sequences of the brain and surrounding structures were obtained  without and with intravenous contrast. CONTRAST:  69mL GADAVIST GADOBUTROL 1 MMOL/ML IV SOLN COMPARISON:  None. FINDINGS: Examination is degraded by motion. BRAIN: There is no acute infarct, acute hemorrhage or extra-axial collection. The white matter signal is normal for the patient's age. There is generalized atrophy without lobar predilection. The midline structures are normal. The hippocampi are normal and symmetric in size and signal. The hypothalamus and mamillary bodies are normal. There is no cortical ectopia or dysplasia. No abnormal contrast enhancement. VASCULAR: The major intracranial arterial and venous sinus flow voids are normal. Susceptibility-sensitive sequences show no chronic microhemorrhage or superficial siderosis. SKULL AND UPPER CERVICAL SPINE: Calvarial bone marrow signal is normal. There is no skull base mass. The visualized upper cervical spine and soft tissues are normal. SINUSES/ORBITS: There are no fluid levels or advanced mucosal thickening. The mastoid air cells and middle ear cavities are free of fluid. The orbits are normal. IMPRESSION: 1. No acute intracranial abnormality. 2. Generalized atrophy without lobar predilection. 3. No identifiable seizure etiology. Electronically Signed   By: Deatra Robinson M.D.   On: 11/23/2018 00:14   Ct C-spine No Charge  Result Date: 11/21/2018 CLINICAL DATA:  63 year old female code  stroke presentation today. Right side weakness. EXAM: CT CERVICAL SPINE WITH CONTRAST TECHNIQUE: Multiplanar CT images of the cervical spine were reconstructed from contemporary CTA of the Neck. CONTRAST:  No additional COMPARISON:  CT head, CTA head and neck, and CT perfusion earlier today. FINDINGS: Alignment: Straightening of cervical lordosis. Cervicothoracic junction alignment is within normal limits. Bilateral posterior element alignment is within normal limits. Skull base and vertebrae: Mild motion degradation at C2. Visualized skull base is intact. No  atlanto-occipital dissociation. No acute osseous abnormality identified. Soft tissues and spinal canal: No prevertebral fluid or swelling. No visible canal hematoma. Neck soft tissues reported with the CTA today separately. Disc levels: Widespread cervical spine degeneration. There is ankylosis of the C2-C3 posterior elements on the right. There is advanced disc and endplate degeneration C4-C5 through C6-C7. 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 Subsequently, there is mild and up to moderate multilevel cervical spinal stenosis, likely maximal at C5-C6. Upper chest: Upper thoracic levels appear intact. Other chest findings reported on the CTA today separately. IMPRESSION: 1. No acute osseous abnormality in the cervical spine. 2. 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 Degenerative cervical spinal stenosis from C4-C5 to C6-C7, up to moderate at C5-C6. 3. CTA head and neck today reported separately. Electronically Signed   By: Odessa Fleming M.D.   On: 11/21/2018 21:00   Ct Cerebral Perfusion W Contrast  Result Date: 11/21/2018 CLINICAL DATA:  Focal neuro deficit greater than 6 hours. Right-sided weakness and aphasia. EXAM: CT ANGIOGRAPHY HEAD AND NECK CT PERFUSION BRAIN TECHNIQUE: Multidetector CT imaging of the head and neck was performed using the standard protocol during bolus administration of intravenous contrast. Multiplanar CT image reconstructions and MIPs were obtained to evaluate the vascular anatomy. Carotid stenosis measurements (when applicable) are obtained utilizing NASCET criteria, using the distal internal carotid diameter as the denominator. Multiphase CT imaging of the brain was performed following IV bolus contrast injection. Subsequent parametric perfusion maps were calculated using RAPID software. CONTRAST:  OMNIPAQUE IOHEXOL 350 MG/ML SOLN COMPARISON:  CT head 11/21/2018 FINDINGS: CTA NECK FINDINGS Aortic arch:  Standard branching. Imaged portion shows no evidence of aneurysm or dissection. No significant stenosis of the major arch vessel origins. Right carotid system: No significant right carotid stenosis. Small calcific plaque at the origin of the right external carotid artery. Left carotid system: Mild atherosclerotic disease left carotid bifurcation. No significant left carotid stenosis. Vertebral arteries: Both vertebral arteries patent to the basilar without significant stenosis. Skeleton: Mild degenerative changes in the cervical spine. No acute skeletal abnormality. Other neck: Negative for mass or adenopathy. Upper chest: Left-sided pacemaker. Lung apices clear bilaterally. Review of the MIP images confirms the above findings CTA HEAD FINDINGS Anterior circulation: Cavernous carotid widely patent bilaterally without significant calcification or stenosis. Anterior and middle cerebral arteries widely patent bilaterally. Posterior circulation: Both vertebral arteries patent to the basilar. PICA patent bilaterally. Basilar widely patent. Superior cerebellar and posterior cerebral arteries patent bilaterally. Venous sinuses: Minimal venous contrast due to timing of the scan Anatomic variants: None Review of the MIP images confirms the above findings CT Brain Perfusion Findings: ASPECTS: 10 CBF (<30%) Volume: 0mL Perfusion (Tmax>6.0s) volume: 0mL Mismatch Volume: 0mL Infarction Location:None IMPRESSION: 1. CT perfusion negative for acute ischemia or infarct.  Aspect 10 2. No significant carotid or vertebral artery stenosis in the neck. 3. No significant cranial stenosis or large vessel occlusion. Electronically Signed   By: Marlan Palau M.D.   On:  11/21/2018 18:54   Dg Chest Port 1 View  Result Date: 11/22/2018 CLINICAL DATA:  Altered mental status, fever EXAM: PORTABLE CHEST 1 VIEW COMPARISON:  Radiograph August 19, 2018 FINDINGS: Dual lead pacer/defibrillator pack overlies the left chest wall with leads at the  right atrium and cardiac apex. No consolidation, features of edema, pneumothorax, or effusion. Pulmonary vascularity is normally distributed. The cardiomediastinal contours are unremarkable. No acute osseous or soft tissue abnormality. IMPRESSION: No acute cardiopulmonary abnormality. Electronically Signed   By: Kreg Shropshire M.D.   On: 11/22/2018 01:49   Ct Head Code Stroke Wo Contrast  Result Date: 11/21/2018 CLINICAL DATA:  Code stroke. Focal neuro deficit greater than 6 hours. Rule out stroke. EXAM: CT HEAD WITHOUT CONTRAST TECHNIQUE: Contiguous axial images were obtained from the base of the skull through the vertex without intravenous contrast. COMPARISON:  None. FINDINGS: Brain: Mild atrophy. Negative for acute infarct, hemorrhage, mass. Mild white matter changes appear chronic. Vascular: Negative for hyperdense vessel Skull: Negative Sinuses/Orbits: Negative Other: None ASPECTS ([place] Stroke Program Early CT Score) - Ganglionic level infarction (caudate, lentiform nuclei, internal capsule, insula, M1-M3 cortex): 7 - Supraganglionic infarction (M4-M6 cortex): 3 Total score (0-10 with 10 being normal): 10 IMPRESSION: 1. No acute abnormality 2. ASPECTS is 10 3. These results were called by telephone at the time of interpretation on 11/21/2018 at 6:35 pm to provider Amada Jupiter MD , who verbally acknowledged these results. Electronically Signed   By: Marlan Palau M.D.   On: 11/21/2018 18:37        Scheduled Meds:  aspirin  300 mg Rectal Daily   Or   aspirin  325 mg Oral Daily   insulin aspart  0-9 Units Subcutaneous Q4H   mometasone-formoterol  2 puff Inhalation BID   Continuous Infusions:  sodium chloride Stopped (11/23/18 0407)   acyclovir 815 mg (11/23/18 0411)   aztreonam 2 g (11/23/18 0517)   vancomycin       LOS: 1 day     Jacquelin Hawking, MD [facility] 11/23/2018, 11:42 AM  If 7PM-7AM, please contact night-coverage [email or web address]

## 2018-11-23 NOTE — Progress Notes (Signed)
Pharmacy Antibiotic Note  Anna Rojas is a 63 y.o. 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   Plan: Adjust vanc 1 g q12h d/t no AUC dosing for meningitis.   Height: 5\' 1"  (154.9 cm) Weight: 180 lb (81.6 kg) IBW/kg (Calculated) : 47.8  Temp (24hrs), Avg:97.9 F (36.6 C), Min:96.7 F (35.9 C), Max:98.8 F (37.1 C)  Recent Labs  Lab 11/21/18 1804 11/21/18 1811 11/22/18 0256 11/22/18 1027  WBC 8.9  --  7.8  --   CREATININE 0.64 0.50 0.68  --   LATICACIDVEN  --   --   --  0.8    Estimated Creatinine Clearance: 70.6 mL/min (by C-G formula based on SCr of 0.68 mg/dL).    Allergies  Allergen Reactions  . Beclomethasone Itching, Rash and Other (See Comments)    Flushing skin redness, also  . Brimonidine Hives and Itching  . Codeine Hives and Rash  . Darvon [Propoxyphene] Hives  . Iodinated Diagnostic Agents Hives  . Iodine-131 Hives  . Oxycodone Hives and Rash  . Tranexamic Acid Itching and Rash  . Ceftriaxone Sodium In Dextrose Rash    12/29/17 Developed diffuse pruritic rash on chest, face, hips, arms after finishing rocephin dose.  Patient did have rocephin in 2017 without a problem though...  11/22/2018 - unable to tolerate retrial of ceftriaxone, rash developed after dose.   . Norethindrone Rash  . Brimonidine Tartrate Hives and Itching  . Celecoxib Itching and Rash  . Oxycontin [Oxycodone Hcl] Hives and Rash   Levester Fresh, PharmD, BCPS, BCCCP Clinical Pharmacist [phone]  Please check AMION for all [place] numbers  11/23/2018 10:00 AM

## 2018-11-23 NOTE — Progress Notes (Signed)
OT Cancellation Note  Patient Details Name: Anna Rojas MRN: [id number] DOB: 16-Apr-1955   Cancelled Treatment:    Reason Eval/Treat Not Completed: Other (comment) Per RN pt too confused to attempt OT. Plan for LP under fluoroscopy at some point today. OT will continue to f/u with pt acutely as available and appropriate.  [place], OTR/L Acute Rehab Services Pager [phone] Office [phone]     Almon Register 11/23/2018, 12:25 PM

## 2018-11-23 NOTE — Progress Notes (Signed)
Subjective: Continues to improve. Attempted  Exam: Vitals:   11/23/18 0400 11/23/18 0700  BP: (!) 175/154 (!) 112/93  Pulse: 69 97  Resp: 18   Temp: 97.7 F (36.5 C) 98 F (36.7 C)  SpO2: 92%    Gen: In bed, NAD Resp: non-labored breathing, no acute distress Abd: soft, nt  Neuro: MS: Awake, follows commands, answers some simple CN: Blinks to threat bilaterally, extraocular movements are intact Motor: She moves all extremities spontaneously Sensory: She response to noxious stimulation bilaterally  Pertinent Labs: CMP-unremarkable  Impression: 63 year old female with altered mental status of unclear etiology.  She is improving, and I continue to wonder about CNS infection. LP was attempted at bedside, but unsuccessful.   Will get under fluoro today   Recommendations: 1) lumbar puncture for cells, glucose, protein, HSV 2) will follow.   Roland Rack, MD [facility] [phone]  If 7pm- 7am, please page neurology on call as listed in [place].

## 2018-11-24 ENCOUNTER — Inpatient Hospital Stay (HOSPITAL_COMMUNITY): Payer: Medicaid Other

## 2018-11-24 LAB — GLUCOSE, CAPILLARY
Glucose-Capillary: 279 mg/dL — ABNORMAL HIGH (ref 70–99)
Glucose-Capillary: 154 mg/dL — ABNORMAL HIGH (ref 70–99)
Glucose-Capillary: 254 mg/dL — ABNORMAL HIGH (ref 70–99)
Glucose-Capillary: 76 mg/dL (ref 70–99)
Glucose-Capillary: 179 mg/dL — ABNORMAL HIGH (ref 70–99)

## 2018-11-24 LAB — BASIC METABOLIC PANEL
Anion gap: 9 (ref 5–15)
BUN: 6 mg/dL — ABNORMAL LOW (ref 8–23)
CO2: 25 mmol/L (ref 22–32)
Calcium: 8.5 mg/dL — ABNORMAL LOW (ref 8.9–10.3)
Chloride: 104 mmol/L (ref 98–111)
Creatinine, Ser: 0.57 mg/dL (ref 0.44–1.00)
GFR calc Af Amer: >60 mL/min (ref >60)
GFR calc non Af Amer: >60 mL/min (ref >60)
Glucose, Bld: 173 mg/dL — ABNORMAL HIGH (ref 70–99)
Potassium: 3.4 mmol/L — ABNORMAL LOW (ref 3.5–5.1)
Sodium: 138 mmol/L (ref 135–145)

## 2018-11-24 LAB — CBC
HCT: 37.8 % (ref 36.0–46.0)
Hemoglobin: 12.7 g/dL (ref 12.0–15.0)
MCH: 29.8 pg (ref 26.0–34.0)
MCHC: 33.6 g/dL (ref 30.0–36.0)
MCV: 88.7 fL (ref 80.0–100.0)
Platelets: 164 10*3/uL (ref 150–400)
RBC: 4.26 MIL/uL (ref 3.87–5.11)
RDW: 12.8 % (ref 11.5–15.5)
WBC: 5.9 10*3/uL (ref 4.0–10.5)
nRBC: 0 % (ref 0.0–0.2)

## 2018-11-24 MED ORDER — NYSTATIN 100000 UNIT/GM EX POWD
Freq: Two times a day (BID) | CUTANEOUS | Status: DC
  Administered 2018-11-24 – 2018-11-29 (×11): via TOPICAL
  Filled 2018-11-24: qty 15

## 2018-11-24 NOTE — Progress Notes (Signed)
PROGRESS NOTE    Anna Rojas  WUJ:[id number] DOB: Jun 10, 1955 DOA: 11/21/2018 PCP: Patient, No Pcp Per   Brief Narrative: Anna ShipMelissa Rojas is a 63 y.o. female with history of torsades status post ICD placement, history of complete heart block status post pacemaker placement, diabetes mellitus, hypothyroidism. Patient presented secondary to confusion with unknown etiology. Obtunded.   Assessment & Plan:   Principal Problem:   Acute encephalopathy Active Problems:   Uncontrolled type 2 diabetes mellitus with hyperglycemia (HCC)   Hypothyroidism   Acute metabolic encephalopathy   Acute encephalopathy Unknown etiology. Concern for viral etiology with fevers, including meningitis. MRI performed and significant for generalized atrophy. EEG non-specific. LP not very specific but does not suggest bacterial meningitis. -Neurology recommendations: Prolonged EEG -Acyclovir; Discontinue Vancomycin and Aztreonam -D5 1/2 NS fluids  Hypokalemia -Continue potassium in fluids  Fever Unknown etiology but possibly related to mental status change. procalcitonin is undetectable. No leukocytosis. No recurrent fevers  Diabetes mellitus, type 2 -Continue SSI qAC  Hypothyroidism Patient's TSH is <0.01 on admission. She takes Synthroid 300 mcg as an outpatient. Elevated free T4 with low free T3. Upon chart review, appears patient has been on Synthroid 300 mcg for at least the whole year. -Hold Synthroid for now. Held since 10/2  History of torsades Patient is s/p ICD   DVT prophylaxis: SCDs Code Status:   Code Status: Full Code Family Communication: None at bedside Disposition Plan: Discharge pending clinical improvement, neurology recommendations   Consultants:   Neurology  Procedures:   None  Antimicrobials:  Vancomycin  Aztreonam  Acyclovir  Ceftriaxone   Subjective: Patient reports a mild headache. Eating breakfast.  Objective: Vitals:   11/23/18 2121 11/23/18 2326  11/24/18 0330 11/24/18 0856  BP:  132/79 115/85 (!) 141/76  Pulse:  75 90 88  Resp:  17 18 17   Temp:  97.7 F (36.5 C) (!) 97.5 F (36.4 C) 97.9 F (36.6 C)  TempSrc:  Oral Oral Oral  SpO2: 100% 100% 99% 99%  Weight:      Height:        Intake/Output Summary (Last 24 hours) at 11/24/2018 1206 Last data filed at 11/24/2018 0305 Gross per 24 hour  Intake 1842.36 ml  Output -  Net 1842.36 ml   Filed Weights   11/22/18 0230  Weight: 81.6 kg    Examination:  General exam: Appears calm and comfortable Respiratory system: Clear to auscultation. Respiratory effort normal. Cardiovascular system: S1 & S2 heard, RRR. No murmurs, rubs, gallops or clicks. Gastrointestinal system: Abdomen is nondistended, soft and nontender. No organomegaly or masses felt. Normal bowel sounds heard. Central nervous system: Alert and disoriented. Extremities: No edema. No calf tenderness Skin: No cyanosis. No rashes Psychiatry: Judgement and insight appear impaired.    Data Reviewed: I have personally reviewed following labs and imaging studies  CBC: Recent Labs  Lab 11/21/18 1804 11/21/18 1811 11/22/18 0256 11/24/18 0718  WBC 8.9  --  7.8 5.9  NEUTROABS 6.6  --   --   --   HGB 14.9 15.0 13.8 12.7  HCT 44.4 44.0 41.3 37.8  MCV 87.9  --  88.6 88.7  PLT 224  --  228 164   Basic Metabolic Panel: Recent Labs  Lab 11/21/18 1804 11/21/18 1811 11/22/18 0256 11/23/18 1033 11/24/18 0718  NA 140 139 142 143 138  K 4.5 4.2 3.4* 3.0* 3.4*  CL 105 105 105 107 104  CO2 24  --  23 26 25   GLUCOSE 162* 153*  122* 117* 173*  BUN 9 11 10  6* 6*  CREATININE 0.64 0.50 0.68 0.50 0.57  CALCIUM 8.9  --  8.9 8.6* 8.5*   GFR: Estimated Creatinine Clearance: 70.6 mL/min (by C-G formula based on SCr of 0.57 mg/dL). Liver Function Tests: Recent Labs  Lab 11/21/18 1804 11/22/18 0256 11/23/18 1033  AST 77* 56* 38  ALT 36 31 26  ALKPHOS 142* 126 96  BILITOT 1.1 1.0 0.7  PROT 6.4* 6.2* 6.0*  ALBUMIN  3.0* 2.9* 2.7*   No results for input(s): LIPASE, AMYLASE in the last 168 hours. Recent Labs  Lab 11/21/18 1920  AMMONIA 10   Coagulation Profile: Recent Labs  Lab 11/21/18 1804  INR 1.2   Cardiac Enzymes: No results for input(s): CKTOTAL, CKMB, CKMBINDEX, TROPONINI in the last 168 hours. BNP (last 3 results) No results for input(s): PROBNP in the last 8760 hours. HbA1C: Recent Labs    11/21/18 2238 11/22/18 0256  HGBA1C 11.0* 11.1*   CBG: Recent Labs  Lab 11/23/18 1322 11/23/18 1611 11/23/18 2348 11/24/18 0434 11/24/18 0851  GLUCAP 101* 122* 187* 279* 154*   Lipid Profile: Recent Labs    11/22/18 0257  CHOL 150  HDL 39*  LDLCALC 85  TRIG 128  CHOLHDL 3.8   Thyroid Function Tests: Recent Labs    11/21/18 1920 11/22/18 1748  TSH <0.010*  --   FREET4  --  1.37*  T3FREE  --  1.7*   Anemia Panel: No results for input(s): VITAMINB12, FOLATE, FERRITIN, TIBC, IRON, RETICCTPCT in the last 72 hours. Sepsis Labs: Recent Labs  Lab 11/22/18 0256 11/22/18 1027  PROCALCITON <0.10  --   LATICACIDVEN  --  0.8    Recent Results (from the past 240 hour(s))  SARS Coronavirus 2 [facility] order, Performed in [facility] hospital lab) Nasopharyngeal Nasopharyngeal Swab     Status: None   Collection Time: 11/21/18  6:40 PM   Specimen: Nasopharyngeal Swab  Result Value Ref Range Status   SARS Coronavirus 2 NEGATIVE NEGATIVE Final    Comment: (NOTE) If result is NEGATIVE SARS-CoV-2 target nucleic acids are NOT DETECTED. The SARS-CoV-2 RNA is generally detectable in upper and lower  respiratory specimens during the acute phase of infection. The lowest  concentration of SARS-CoV-2 viral copies this assay can detect is 250  copies / mL. A negative result does not preclude SARS-CoV-2 infection  and should not be used as the sole basis for treatment or other  patient management decisions.  A negative result may occur with  improper specimen collection / handling,  submission of specimen other  than nasopharyngeal swab, presence of viral mutation(s) within the  areas targeted by this assay, and inadequate number of viral copies  (<250 copies / mL). A negative result must be combined with clinical  observations, patient history, and epidemiological information. If result is POSITIVE SARS-CoV-2 target nucleic acids are DETECTED. The SARS-CoV-2 RNA is generally detectable in upper and lower  respiratory specimens dur ing the acute phase of infection.  Positive  results are indicative of active infection with SARS-CoV-2.  Clinical  correlation with patient history and other diagnostic information is  necessary to determine patient infection status.  Positive results do  not rule out bacterial infection or co-infection with other viruses. If result is PRESUMPTIVE POSTIVE SARS-CoV-2 nucleic acids MAY BE PRESENT.   A presumptive positive result was obtained on the submitted specimen  and confirmed on repeat testing.  While 2019 novel coronavirus  (SARS-CoV-2) nucleic acids  may be present in the submitted sample  additional confirmatory testing may be necessary for epidemiological  and / or clinical management purposes  to differentiate between  SARS-CoV-2 and other Sarbecovirus currently known to infect humans.  If clinically indicated additional testing with an alternate test  methodology [phone]) is advised. The SARS-CoV-2 RNA is generally  detectable in upper and lower respiratory sp ecimens during the acute  phase of infection. The expected result is Negative. Fact Sheet for Patients:  BoilerBrush.com.cy Fact Sheet for Healthcare Providers: https://pope.com/ This test is not yet approved or cleared by the [place] FDA and has been authorized for detection and/or diagnosis of SARS-CoV-2 by FDA under an Emergency Use Authorization (EUA).  This EUA will remain in effect (meaning this test can be  used) for the duration of the COVID-19 declaration under Section 564(b)(1) of the Act, 21 U.S.C. section 360bbb-3(b)(1), unless the authorization is terminated or revoked sooner. Performed at [facility], [address] [place], [place] [address]   Culture, Urine     Status: Abnormal   Collection Time: 11/22/18  1:16 AM   Specimen: Urine, Random  Result Value Ref Range Status   Specimen Description URINE, RANDOM  Final   Special Requests   Final    NONE Performed at [facility], [address] [place], [place] [address]    Culture MULTIPLE SPECIES PRESENT, SUGGEST RECOLLECTION (A)  Final   Report Status 11/22/2018 FINAL  Final  Culture, blood (routine x 2)     Status: None (Preliminary result)   Collection Time: 11/22/18  2:00 AM   Specimen: BLOOD  Result Value Ref Range Status   Specimen Description BLOOD SITE NOT SPECIFIED  Final   Special Requests   Final    BOTTLES DRAWN AEROBIC AND ANAEROBIC Blood Culture adequate volume   Culture   Final    NO GROWTH 2 DAYS Performed at [facility], [address] [place], [place] [address]    Report Status PENDING  Incomplete  Culture, blood (routine x 2)     Status: None (Preliminary result)   Collection Time: 11/22/18  2:15 AM   Specimen: BLOOD  Result Value Ref Range Status   Specimen Description BLOOD SITE NOT SPECIFIED  Final   Special Requests   Final    BOTTLES DRAWN AEROBIC AND ANAEROBIC Blood Culture adequate volume   Culture   Final    NO GROWTH 2 DAYS Performed at [facility], [address], [place], [place] [address]    Report Status PENDING  Incomplete  CSF culture     Status: None (Preliminary result)   Collection Time: 11/23/18  3:57 PM   Specimen: PATH Cytology CSF; Cerebrospinal Fluid  Result Value Ref Range Status   Specimen Description CSF  Final   Special Requests NONE  Final   Gram Stain NO WBC SEEN NO ORGANISMS SEEN CYTOSPIN SMEAR   Final   Culture   Final    NO GROWTH <  24 HOURS Performed at [facility], [address] [place], [place] [address]    Report Status PENDING  Incomplete         Radiology Studies: Mr Laqueta Jean QI Contrast  Result Date: 11/23/2018 CLINICAL DATA:  Altered mental status. Encephalopathy. EXAM: MRI HEAD WITHOUT AND WITH CONTRAST TECHNIQUE: Multiplanar, multiecho pulse sequences of the brain and surrounding structures were obtained without and with intravenous contrast. CONTRAST:  8mL GADAVIST GADOBUTROL 1 MMOL/ML  IV SOLN COMPARISON:  None. FINDINGS: Examination is degraded by motion. BRAIN: There is no acute infarct, acute hemorrhage or extra-axial collection. The white matter signal is normal for the patient's age. There is generalized atrophy without lobar predilection. The midline structures are normal. The hippocampi are normal and symmetric in size and signal. The hypothalamus and mamillary bodies are normal. There is no cortical ectopia or dysplasia. No abnormal contrast enhancement. VASCULAR: The major intracranial arterial and venous sinus flow voids are normal. Susceptibility-sensitive sequences show no chronic microhemorrhage or superficial siderosis. SKULL AND UPPER CERVICAL SPINE: Calvarial bone marrow signal is normal. There is no skull base mass. The visualized upper cervical spine and soft tissues are normal. SINUSES/ORBITS: There are no fluid levels or advanced mucosal thickening. The mastoid air cells and middle ear cavities are free of fluid. The orbits are normal. IMPRESSION: 1. No acute intracranial abnormality. 2. Generalized atrophy without lobar predilection. 3. No identifiable seizure etiology. Electronically Signed   By: Deatra Robinson M.D.   On: 11/23/2018 00:14   Dg Fl Guided Lumbar Puncture  Result Date: 11/23/2018 CLINICAL DATA:  Inpatient. Altered mental status. Concern for CNS infection. EXAM: DIAGNOSTIC LUMBAR PUNCTURE UNDER FLUOROSCOPIC GUIDANCE FLUOROSCOPY TIME:  Fluoroscopy Time:  0 minutes 48 seconds  Radiation Exposure Index (if provided by the fluoroscopic device): 17.8 mGy Number of Acquired Spot Images: 0 PROCEDURE: Informed consent was obtained from the patient's fiance prior to the procedure, including potential complications of headache, allergy, and pain. With the patient prone, the lower back was prepped with Betadine. 1% Lidocaine was used for local anesthesia. Lumbar puncture was performed at the L2-3 level using a 20 gauge needle with return of clear CSF. Opening pressure could not be obtained due to very slow CSF flow. 8 ml of CSF were obtained for laboratory studies. The patient tolerated the procedure well and there were no apparent complications. IMPRESSION: Technically successful diagnostic lumbar puncture under fluoroscopic guidance. Electronically Signed   By: Delbert Phenix M.D.   On: 11/23/2018 16:09        Scheduled Meds: . aspirin  300 mg Rectal Daily   Or  . aspirin  325 mg Oral Daily  . insulin aspart  0-9 Units Subcutaneous Q4H  . mometasone-formoterol  2 puff Inhalation BID   Continuous Infusions: . sodium chloride Stopped (11/24/18 0227)  . acyclovir 815 mg (11/24/18 1142)  . dextrose 5 % and 0.45 % NaCl with KCl 40 mEq/L 100 mL/hr at 11/24/18 0544     LOS: 2 days     Jacquelin Hawking, MD [facility] 11/24/2018, 12:06 PM  If 7PM-7AM, please contact night-coverage [email or web address]

## 2018-11-24 NOTE — Progress Notes (Signed)
LTM EEG hooked up and running - no initial skin breakdown - push button tested - neuro notified.  

## 2018-11-24 NOTE — Evaluation (Signed)
Occupational Therapy Evaluation Patient Details Name: Anna Rojas MRN: [id number] DOB: [birth year] Today's Date: 11/24/2018    History of Present Illness Anna Rojas is a 63 y.o. female with history of torsades status post ICD placement, history of complete heart block status post pacemaker placement, diabetes mellitus, hypothyroidism was brought to the ER patient was found to be confused by patient's family. Pt with history of alcohol abuse but has been sober for 6 months until she had 7 beers 2 days ago. Pt found to have encephalopathy, Underwent LP to rule out meningitis.   Clinical Impression   Patient supine in bed, oriented to self and place.  Admitted for above and limited to problem list below, including impaired cognition, balance, decreased activity tolerance, impaired coordination, motor planning, depth perception (undershooting) and sequencing.  Patient currently requires +2 assist mod assist for bed mobility, +2 mod assist for toilet transfers, total assist for toileting and LB ADLS. Cognitively she able to follow 1 step commands, impulsive with poor memory and needing constant redirection to task. She will benefit from continued OT services while admitted and after dc at SNF level in order to maximize independence and safety with ADLs, mobility prior to dc home (as she is home alone during the week).     Follow Up Recommendations  SNF;Supervision/Assistance - 24 hour    Equipment Recommendations  3 in 1 bedside commode    Recommendations for Other Services Speech consult     Precautions / Restrictions Precautions Precautions: Fall Precaution Comments: confused, garbled speech Restrictions Weight Bearing Restrictions: No      Mobility Bed Mobility Overal bed mobility: Needs Assistance Bed Mobility: Supine to Sit;Sit to Supine     Supine to sit: Mod assist;+2 for physical assistance Sit to supine: Mod assist;+2 for physical assistance   General bed mobility  comments: pt with no initiation or processing of verbal cues for squencing, requiring tactile cues/hand over hand to complete in addition to  modAx2 for physical assist. pt with signifcant posterior lean and c/o dizziness upon sitting up  Transfers Overall transfer level: Needs assistance Equipment used: 2 person hand held assist Transfers: Sit to/from [employer] Sit to Stand: Mod assist;+2 physical assistance Stand pivot transfers: Mod assist;+2 physical assistance       General transfer comment: pt with signficant posterior lean, would lean forward to verbal cues but unable to maintain, pt required modA to advance bilat LE during std pvt to [facility] then pt able to advance LEs and side step back to [facility] with modAx2 to maitain midline and upright posture. Pt cont to have significant posterior lean    Balance Overall balance assessment: Needs assistance Sitting-balance support: Feet supported;No upper extremity supported Sitting balance-Leahy Scale: Zero Sitting balance - Comments: signficant posterior lean without ability to self correct requiring assist to maintain midline   Standing balance support: Bilateral upper extremity supported Standing balance-Leahy Scale: Poor Standing balance comment: pt with posterior lean requiring max verbal and tactile cues to achieve midline                           ADL either performed or assessed with clinical judgement   ADL Overall ADL's : Needs assistance/impaired Eating/Feeding: Maximal assistance;Sitting Eating/Feeding Details (indicate cue type and reason): requires support to bring straw to mouth to initate drinking  Grooming: Maximal assistance;Sitting Grooming Details (indicate cue type and reason): pt able to wash face with L hand, increased time required; anticipate increased  difficutly sequencing more complex grooming tasks  Upper Body Bathing: Maximal assistance;Sitting   Lower Body Bathing: Total assistance;+2  for physical assistance;+2 for safety/equipment;Sit to/from stand   Upper Body Dressing : Total assistance;Sitting   Lower Body Dressing: Total assistance;+2 for physical assistance;+2 for safety/equipment;Sit to/from stand Lower Body Dressing Details (indicate cue type and reason): pt unable to don socks, +2 sit to stand  Toilet Transfer: Moderate assistance;+2 for physical assistance;+2 for safety/equipment;Stand-pivot;BSC   Toileting- Clothing Manipulation and Hygiene: Total assistance;+2 for physical assistance;+2 for safety/equipment;Sit to/from stand       Functional mobility during ADLs: Moderate assistance;Cueing for safety;Cueing for sequencing;+2 for physical assistance;+2 for safety/equipment General ADL Comments: pt limited by cognition, balance, weakness      Vision   Vision Assessment?: Vision impaired- to be further tested in functional context Additional Comments: pt presenting with poor depth perception, undershooting when reaching for items from therapist; limited assessment due to cognition     Perception     Praxis      Pertinent Vitals/Pain Pain Assessment: No/denies pain     Hand Dominance Right   Extremity/Trunk Assessment Upper Extremity Assessment Upper Extremity Assessment: Generalized weakness;RUE deficits/detail;LUE deficits/detail RUE Deficits / Details: impaired functional coordination and motor planning, difficult to assess d/t cognition   RUE Coordination: decreased fine motor;decreased gross motor LUE Deficits / Details: impaired functional coordination and motor planning, difficult to assess d/t cognition   LUE Coordination: decreased fine motor;decreased gross motor   Lower Extremity Assessment Lower Extremity Assessment: Defer to PT evaluation   Cervical / Trunk Assessment Cervical / Trunk Assessment: Normal   Communication Communication Communication: Expressive difficulties(garbled speech, tangential, random, not pertinent)    Cognition Arousal/Alertness: Awake/alert Behavior During Therapy: Restless Overall Cognitive Status: Impaired/Different from baseline Area of Impairment: Orientation;Attention;Memory;Following commands;Safety/judgement;Awareness;Problem solving                 Orientation Level: Disoriented to;Time;Situation Current Attention Level: Focused Memory: Decreased short-term memory Following Commands: Follows one step commands with increased time;Follows one step commands inconsistently Safety/Judgement: Decreased awareness of safety;Decreased awareness of deficits Awareness: Emergent Problem Solving: Slow processing;Decreased initiation;Requires tactile cues;Requires verbal cues;Difficulty sequencing General Comments: pt impulsive, poor awareness into deficits, easily distracted and requires constant redirection    General Comments  vss, [facility], dependent for hygiene    Exercises     Shoulder Instructions      Home Living Family/patient expects to be discharged to:: Skilled nursing facility                                 Additional Comments: pt lives in a 1 story home with fiance      Prior Functioning/Environment Level of Independence: Independent        Comments: pt didn't use cane/walker PTA, anticipate independent ADLs. patients fiance is a [employer] architecttruck driver and is gone all week and only home on the weekends        OT Problem List: Decreased strength;Decreased activity tolerance;Impaired balance (sitting and/or standing);Decreased coordination;Decreased cognition;Decreased safety awareness;Decreased knowledge of use of DME or AE;Decreased knowledge of precautions;Obesity;Impaired UE functional use;Decreased range of motion;Impaired vision/perception      OT Treatment/Interventions: Self-care/ADL training;Neuromuscular education;DME and/or AE instruction;Therapeutic activities;Balance training;Patient/family education;Cognitive  remediation/compensation;Visual/perceptual remediation/compensation    OT Goals(Current goals can be found in the care plan section) Acute Rehab OT Goals Patient Stated Goal: none stated OT Goal Formulation: Patient unable to participate in goal  setting Time For Goal Achievement: 12/08/18 Potential to Achieve Goals: Fair  OT Frequency: Min 2X/week   Barriers to D/C: Decreased caregiver support          Co-evaluation PT/OT/SLP Co-Evaluation/Treatment: Yes Reason for Co-Treatment: Complexity of the patient's impairments (multi-system involvement);To address functional/ADL transfers;Necessary to address cognition/behavior during functional activity;For patient/therapist safety PT goals addressed during session: Mobility/safety with mobility OT goals addressed during session: ADL's and self-care      AM-PAC OT "6 Clicks" Daily Activity     Outcome Measure Help from another person eating meals?: A Lot Help from another person taking care of personal grooming?: A Lot Help from another person toileting, which includes using toliet, bedpan, or urinal?: Total Help from another person bathing (including washing, rinsing, drying)?: A Lot Help from another person to put on and taking off regular upper body clothing?: A Lot Help from another person to put on and taking off regular lower body clothing?: Total 6 Click Score: 10   End of Session Equipment Utilized During Treatment: Gait belt Nurse Communication: Mobility status  Activity Tolerance: Patient tolerated treatment well Patient left: with call bell/phone within reach;in bed;with bed alarm set  OT Visit Diagnosis: Other abnormalities of gait and mobility (R26.89);Muscle weakness (generalized) (M62.81);Cognitive communication deficit (R41.841);Other symptoms and signs involving cognitive function                Time: 5188-4166 OT Time Calculation (min): 24 min Charges:  OT General Charges $OT Visit: 1 Visit OT Evaluation $OT Eval  Moderate Complexity: [place], OT Acute Rehabilitation Services Pager [phone] Office [phone]   Delight Stare 11/24/2018, 12:10 PM

## 2018-11-24 NOTE — Evaluation (Signed)
Speech Language Pathology Evaluation Patient Details Name: Anna Rojas MRN: [id number] DOB: Aug 26, 1955 Today's Date: 11/24/2018 Time: 4098-1191 SLP Time Calculation (min) (ACUTE ONLY): 10 min  Problem List:  Patient Active Problem List   Diagnosis Date Noted  . Acute encephalopathy 11/21/2018  . Uncontrolled type 2 diabetes mellitus with hyperglycemia ([place]) 11/21/2018  . Hypothyroidism 11/21/2018  . Acute metabolic encephalopathy 47/82/9562   Past Medical History:  Past Medical History:  Diagnosis Date  . Diabetes mellitus without complication ([place])   . Hypothyroidism    Past Surgical History:  Past Surgical History:  Procedure Laterality Date  . CARDIAC DEFIBRILLATOR PLACEMENT    . PACEMAKER PLACEMENT     HPI:  Anna Rojas is a 63 y.o. female with history of torsades status post ICD placement, history of complete heart block status post pacemaker placement, diabetes mellitus, hypothyroidism was brought to the ER patient was found to be confused by patient's family. Pt with history of alcohol abuse but has been sober for 6 months until she had 7 beers 2 days ago. Pt found to have encephalopathy, Underwent LP to rule out meningitis.   Assessment / Plan / Recommendation Clinical Impression  Brief cognitive evaluation complete. Patient with very inconsistent cognitive-linguistic abilities. Speech intermittently dysarthric sounding however upon further evaluation patient appears to present with fluctuating levels of possible aphasia and/or language of confusion significantly impacting speech intelligibility.  Veral output characterized by periods which include frequent neologisms and significant language of confusion, followed by ability to name single objects with 100% accuracy and produce 3-4 intelligible appropriate utterances. Oriented to self and place only and even this is inconsisent. Attention significant impaired as well as pragmatics with frequent episodes of inappropriate  laughing. Neurologist present for portion of evaluation and notes the same. Questioning seizure activity. At this time given extent of deficits without clear origin, SLP will continue to f/u for ongoing diagnostic cognitive treatment to maximize functional communication and ability to participate in care.     SLP Assessment  SLP Recommendation/Assessment: Patient needs continued Speech Lanaguage Pathology Services SLP Visit Diagnosis: Cognitive communication deficit (R41.841)    Follow Up Recommendations  Other (comment)(TBD)    Frequency and Duration min 2x/week  2 weeks      SLP Evaluation Cognition  Overall Cognitive Status: Impaired/Different from baseline Arousal/Alertness: Awake/alert Orientation Level: Oriented to person;Oriented to place;Disoriented to time;Disoriented to situation Attention: Focused Focused Attention: Impaired Focused Attention Impairment: Verbal basic;Functional basic Memory: Impaired Memory Impairment: Storage deficit;Retrieval deficit Awareness: Impaired Awareness Impairment: Intellectual impairment Problem Solving: Impaired Problem Solving Impairment: Verbal basic;Functional basic Behaviors: Lability Safety/Judgment: Impaired       Comprehension  Auditory Comprehension Overall Auditory Comprehension: Impaired Yes/No Questions: Impaired Basic Biographical Questions: 0-25% accurate Commands: Impaired One Step Basic Commands: 0-24% accurate Interfering Components: Attention Reading Comprehension Reading Status: Not tested    Expression Expression Primary Mode of Expression: Verbal Verbal Expression Overall Verbal Expression: Impaired Initiation: No impairment Automatic Speech: Social Response Level of Generative/Spontaneous Verbalization: Sentence Repetition: Impaired Level of Impairment: Word level Naming: Impairment Responsive: Not tested Confrontation: Impaired Convergent: 75-100% accurate Verbal Errors: Neologisms;Language of  confusion;Not aware of errors(very inconsistent) Pragmatics: Impairment Impairments: Topic appropriateness;Turn Taking;Topic maintenance(intermittent inappropriate laughing) Written Expression Dominant Hand: Right   Oral / Motor  Oral Motor/Sensory Function Overall Oral Motor/Sensory Function: Within functional limits Motor Speech Overall Motor Speech: (TBD)   GO                   Krystol Rocco MA, CCC-SLP  Alakai Macbride Meryl 11/24/2018, 1:07 PM

## 2018-11-24 NOTE — Evaluation (Addendum)
Physical Therapy Evaluation Patient Details Name: Anna Rojas MRN: [id number] DOB: December 13, 1955 Today's Date: 11/24/2018   History of Present Illness  Anna Rojas is a 63 y.o. female with history of torsades status post ICD placement, history of complete heart block status post pacemaker placement, diabetes mellitus, hypothyroidism was brought to the ER patient was found to be confused by patient's family. Pt with history of alcohol abuse but has been sober for 6 months until she had 7 beers 2 days ago. Pt found to have encephalopathy, Underwent LP to rule out meningitis.  Clinical Impression  Pt admitted with above. Pt alert and aware she is in the hospital however has tangetal, non-relevant speech, severely impaired processing, sequencing, depth perception, balance, and command follow. Pt requiring modAx2 for all mobility at this time. PTA pt was indep and home alone during the week while fiance is away during the week as he is a Administrator. Recommended SNF  To allow longer recovery time to achieve safe mod I Level of function before returning home. Acute PT to cont to follow.    Follow Up Recommendations SNF;Supervision/Assistance - 24 hour    Equipment Recommendations  Rolling walker with 5" wheels;3in1 (PT)    Recommendations for Other Services       Precautions / Restrictions Precautions Precautions: Fall Precaution Comments: confused, garbled speech Restrictions Weight Bearing Restrictions: No      Mobility  Bed Mobility Overal bed mobility: Needs Assistance Bed Mobility: Supine to Sit;Sit to Supine     Supine to sit: Mod assist;+2 for physical assistance Sit to supine: Mod assist;+2 for physical assistance   General bed mobility comments: pt with no initiation or processing of verbal cues for squencing, requiring tactile cues/hand over hand to complete in addition to  modAx2 for physical assist. pt with signifcant posterior lean and c/o dizziness upon sitting  up  Transfers Overall transfer level: Needs assistance Equipment used: 2 person hand held assist Transfers: Sit to/from [facility] Sit to Stand: Mod assist;+2 physical assistance Stand pivot transfers: Mod assist;+2 physical assistance       General transfer comment: pt with signficant posterior lean, would lean forward to verbal cues but unable to maintain, pt required modA to advance bilat LE during std pvt to [facility] then pt able to advance LEs and side step back to [facility] with modAx2 to maitain midline and upright posture. Pt cont to have significant posterior lean  Ambulation/Gait             General Gait Details: unable to amb at this time, limited to side steps to [facility] with modAx2  Stairs            Wheelchair Mobility    Modified Rankin (Stroke Patients Only)       Balance Overall balance assessment: Needs assistance Sitting-balance support: Feet supported;No upper extremity supported Sitting balance-Leahy Scale: Zero Sitting balance - Comments: signficant posterior lean without ability to self correct requiring assist to maintain midline   Standing balance support: Bilateral upper extremity supported Standing balance-Leahy Scale: Poor Standing balance comment: pt with posterior lean requiring max verbal and tactile cues to achieve midline                             Pertinent Vitals/Pain Pain Assessment: No/denies pain    Home Living Family/patient expects to be discharged to:: Skilled nursing facility  Additional Comments: pt lives in a 1 story home with fiance    Prior Function Level of Independence: Independent         Comments: pt didn't use cane/walker PTA. patients fiance is a [employer] architect and is gone all week and only home on the weekends     Hand Dominance   Dominant Hand: Right    Extremity/Trunk Assessment   Upper Extremity Assessment Upper Extremity Assessment: Defer to OT  evaluation(noted bilat UE impaired sequencing and coordination)    Lower Extremity Assessment Lower Extremity Assessment: Generalized weakness(bilat impaired co-ordination, able to stand without buckling)    Cervical / Trunk Assessment Cervical / Trunk Assessment: Normal  Communication   Communication: Expressive difficulties(garbled speech, tangential, random, not pertinent)  Cognition Arousal/Alertness: Awake/alert Behavior During Therapy: Restless Overall Cognitive Status: Impaired/Different from baseline Area of Impairment: Orientation;Attention;Memory;Following commands;Safety/judgement;Awareness;Problem solving                 Orientation Level: Disoriented to;Time;Situation Current Attention Level: Focused Memory: Decreased short-term memory Following Commands: Follows one step commands with increased time;Follows one step commands inconsistently Safety/Judgement: Decreased awareness of safety;Decreased awareness of deficits Awareness: Emergent Problem Solving: Slow processing;Decreased initiation;Requires tactile cues;Requires verbal cues;Difficulty sequencing General Comments: pt with very impaired sequencing, processing, depth perception, and no initiation of tasks asked. Pt reported upon PT/OT arrival "I"m going to pee my pants." pt assist to commode and then had to be told several time to pee. Pt with decreased attn and easily distracted. pt initiating conversation however topics not related to task at hand or medical situation.       General Comments General comments (skin integrity, edema, etc.): vss, assisted to [facility], dependent for hygiene    Exercises     Assessment/Plan    PT Assessment Patient needs continued PT services  PT Problem List Decreased strength;Decreased range of motion;Decreased activity tolerance;Decreased balance;Decreased mobility;Decreased coordination;Decreased cognition;Decreased knowledge of use of DME;Decreased safety awareness        PT Treatment Interventions DME instruction;Gait training;Stair training;Functional mobility training;Therapeutic activities;Therapeutic exercise;Balance training    PT Goals (Current goals can be found in the Care Plan section)  Acute Rehab PT Goals PT Goal Formulation: Patient unable to participate in goal setting Time For Goal Achievement: 12/08/18 Potential to Achieve Goals: Fair    Frequency Min 3X/week   Barriers to discharge Decreased caregiver support alone 5x/wk    Co-evaluation PT/OT/SLP Co-Evaluation/Treatment: Yes Reason for Co-Treatment: Complexity of the patient's impairments (multi-system involvement);For patient/therapist safety;Necessary to address cognition/behavior during functional activity PT goals addressed during session: Mobility/safety with mobility         AM-PAC PT "6 Clicks" Mobility  Outcome Measure Help needed turning from your back to your side while in a flat bed without using bedrails?: A Lot Help needed moving from lying on your back to sitting on the side of a flat bed without using bedrails?: A Lot Help needed moving to and from a bed to a chair (including a wheelchair)?: A Lot Help needed standing up from a chair using your arms (e.g., wheelchair or bedside chair)?: A Lot Help needed to walk in hospital room?: Total Help needed climbing 3-5 steps with a railing? : Total 6 Click Score: 10    End of Session Equipment Utilized During Treatment: Gait belt Activity Tolerance: Patient tolerated treatment well Patient left: in bed;with call bell/phone within reach;with bed alarm set Nurse Communication: Mobility status PT Visit Diagnosis: Unsteadiness on feet (R26.81);Muscle weakness (generalized) (M62.81);Difficulty in walking, not elsewhere  classified (R26.2)    Time: [id number] PT Time Calculation (min) (ACUTE ONLY): 26 min   Charges:   PT Evaluation $PT Eval Moderate Complexity: 1 Mod          Lewis ShockAshly Egon Dittus, PT, DPT Acute  Rehabilitation Services Pager #: [phone] Office #: [phone]   Iona Hansenshly M Meloney Feld 11/24/2018, 11:15 AM

## 2018-11-24 NOTE — Evaluation (Signed)
Clinical/Bedside Swallow Evaluation Patient Details  Name: [name] MRN: [id number] Date of Birth: Nov 04, 1955  Today's Date: 11/24/2018 Time: SLP Start Time (ACUTE ONLY): 1055 SLP Stop Time (ACUTE ONLY): 1105 SLP Time Calculation (min) (ACUTE ONLY): 10 min  Past Medical History:  Past Medical History:  Diagnosis Date  . Diabetes mellitus without complication ([place])   . Hypothyroidism    Past Surgical History:  Past Surgical History:  Procedure Laterality Date  . CARDIAC DEFIBRILLATOR PLACEMENT    . PACEMAKER PLACEMENT     HPI:  Anna Rojas is a 63 y.o. female with history of torsades status post ICD placement, history of complete heart block status post pacemaker placement, diabetes mellitus, hypothyroidism was brought to the ER patient was found to be confused by patient's family. Pt with history of alcohol abuse but has been sober for 6 months until she had 7 beers 2 days ago. Pt found to have encephalopathy, Underwent LP to rule out meningitis.   Assessment / Plan / Recommendation Clinical Impression  Patient presents with a largely normal oropharyngeal swallow. Oral transit swift with the exception of regular texture solids in which a delay occurred due to poor attention span. Otherwise however, patient with full oral clearance and no overt indication of aspiration across consistencies. No further SLP needs indicated for swallowing.  SLP Visit Diagnosis: Dysphagia, unspecified (R13.10)    Aspiration Risk  Mild aspiration risk    Diet Recommendation Regular;Thin liquid   Liquid Administration via: Cup;Straw Medication Administration: Whole meds with liquid Supervision: Patient able to self feed Compensations: Slow rate;Small sips/bites Postural Changes: Seated upright at 90 degrees    Other  Recommendations Oral Care Recommendations: Oral care BID   Follow up Recommendations None        Swallow Study   General HPI: [name] is a 63 y.o. female with history of  torsades status post ICD placement, history of complete heart block status post pacemaker placement, diabetes mellitus, hypothyroidism was brought to the ER patient was found to be confused by patient's family. Pt with history of alcohol abuse but has been sober for 6 months until she had 7 beers 2 days ago. Pt found to have encephalopathy, Underwent LP to rule out meningitis. Type of Study: Bedside Swallow Evaluation Previous Swallow Assessment: none Diet Prior to this Study: Regular;Thin liquids Temperature Spikes Noted: No Respiratory Status: Room air History of Recent Intubation: No Behavior/Cognition: Alert;Cooperative;Confused;Distractible;Requires cueing Oral Cavity Assessment: Dry Oral Care Completed by SLP: Recent completion by staff Oral Cavity - Dentition: Adequate natural dentition Vision: Functional for self-feeding Self-Feeding Abilities: Able to feed self Patient Positioning: Upright in bed Baseline Vocal Quality: Normal Volitional Cough: Cognitively unable to elicit Volitional Swallow: Unable to elicit    Oral/Motor/Sensory Function Overall Oral Motor/Sensory Function: Within functional limits   Ice Chips Ice chips: Not tested   Thin Liquid Thin Liquid: Within functional limits Presentation: Cup;Self Fed;Straw    Nectar Thick Nectar Thick Liquid: Not tested   Honey Thick Honey Thick Liquid: Not tested   Puree Puree: Not tested   Solid     Solid: Within functional limits(mildly delayed oral transit due to poor attention)     Fergie Sherbert MA, CCC-SLP   Samary Shatz Meryl 11/24/2018,12:51 PM

## 2018-11-24 NOTE — Progress Notes (Signed)
Subjective: Big improvement compared to yesterday, but still with some improvement.   Exam: Vitals:   11/24/18 0330 11/24/18 0856  BP: 115/85 (!) 141/76  Pulse: 90 88  Resp: 18 17  Temp: (!) 97.5 F (36.4 C) 97.9 F (36.6 C)  SpO2: 99% 99%   Gen: In bed, NAD Resp: non-labored breathing, no acute distress Abd: soft, nt  Neuro: MS: Awake, follows commands, answers some simple questions and follwos commands. Speech is more fluent, though with occasional neologism and some perseveration.  CN: able to count fingers in both hemifields, extraocular movements are intact, she has had a waxing/waning right facial droop that has been present to some degree since arrival. Also a waxing/waning aphasia Motor: Able to hold both arms against gravity for 10 seconds, no clear weakness of legs.  Sensory: She endorses symmetric sensation to LT  Pertinent Labs: CMP-unremarkable B1-pending Ammonia- 10 TSH- low(on home Synthroid) Free T3- 1.7(range 2.0-2.4) Free T4-1.37 (range 0.61-1.12)  CSF-  RBC-1 WBC-1 Glucose- 74 Protein- 63   Impression: 63 year old female with altered mental status of unclear etiology that started in the setting of her first time drinking in many months. LP without evidence of infection and a protein of 63 is very nonspecific in the setting of diabetes.  No pleocytosis essentially rules out bacterial infection, and makes herpes much much less likely, though I would continue acyclovir until we have a negative PCR.  She is making improvement day by day, but with a waxing/waning character, I would favor continuous EEG to rule out seizure.   Recommendations: 1) can discontinue antibacterials from meningitic coverage standpoint 2) overnight EEG 3) continue PT, OT 4) would continue acyclovir until HSV by PCR is back.   Roland Rack, MD [facility] [phone]  If 7pm- 7am, please page neurology on call as listed in [place].

## 2018-11-25 LAB — GLUCOSE, CAPILLARY
Glucose-Capillary: 109 mg/dL — ABNORMAL HIGH (ref 70–99)
Glucose-Capillary: 119 mg/dL — ABNORMAL HIGH (ref 70–99)
Glucose-Capillary: 130 mg/dL — ABNORMAL HIGH (ref 70–99)
Glucose-Capillary: 145 mg/dL — ABNORMAL HIGH (ref 70–99)
Glucose-Capillary: 188 mg/dL — ABNORMAL HIGH (ref 70–99)
Glucose-Capillary: 253 mg/dL — ABNORMAL HIGH (ref 70–99)
Glucose-Capillary: 94 mg/dL (ref 70–99)

## 2018-11-25 LAB — URINE CULTURE: Culture: <10000 — AB

## 2018-11-25 LAB — VDRL, CSF: VDRL Quant, CSF: NONREACTIVE

## 2018-11-25 MED ORDER — HALOPERIDOL LACTATE 5 MG/ML IJ SOLN
2.0000 mg | Freq: Once | INTRAMUSCULAR | Status: AC
  Administered 2018-11-25: 02:00:00 2 mg via INTRAVENOUS
  Filled 2018-11-25: qty 1

## 2018-11-25 MED ORDER — HALOPERIDOL LACTATE 5 MG/ML IJ SOLN
2.0000 mg | Freq: Once | INTRAMUSCULAR | Status: AC
  Administered 2018-11-25: 21:00:00 2 mg via INTRAVENOUS
  Filled 2018-11-25: qty 1

## 2018-11-25 NOTE — Progress Notes (Signed)
Interim Neurology Note  EEG with diffuse slowing and no seizures. Recs as in todays earlier progress note.  -- Amie Portland, MD

## 2018-11-25 NOTE — Progress Notes (Signed)
LTM maint complete - no skin breakdown under:  Fp1, fp2, f3

## 2018-11-25 NOTE — NC FL2 (Signed)
[place] LEVEL OF CARE SCREENING TOOL     IDENTIFICATION  Patient Name: Anna Rojas Birthdate: [birth year] Sex: female Admission Date (Current Location): 11/21/2018  [facility] and [place] Number:  Monia Pouch)   Facility and Address:  [facility] [place], [place] [address] [place], [place] [address]      Provider Number: [id number]  Attending Physician Name and Address:  Mariel Aloe, MD  Relative Name and Phone Number:       Current Level of Care: Hospital Recommended Level of Care: [place] Prior Approval Number:    Date Approved/Denied:   PASRR Number: [id number]  Discharge Plan: SNF    Current Diagnoses: Patient Active Problem List   Diagnosis Date Noted  . Acute encephalopathy 11/21/2018  . Uncontrolled type 2 diabetes mellitus with hyperglycemia ([place]) 11/21/2018  . Hypothyroidism 11/21/2018  . Acute metabolic encephalopathy 74/25/9563    Orientation RESPIRATION BLADDER Height & Weight     Self, Place  Normal Incontinent Weight: 180 lb (81.6 kg) Height:  5\' 1"  (154.9 cm)  BEHAVIORAL SYMPTOMS/MOOD NEUROLOGICAL BOWEL NUTRITION STATUS      Incontinent Diet(regular)  AMBULATORY STATUS COMMUNICATION OF NEEDS Skin   Extensive Assist Verbally Normal                       Personal Care Assistance Level of Assistance  Bathing, Feeding, Dressing Bathing Assistance: Maximum assistance Feeding assistance: Limited assistance Dressing Assistance: Maximum assistance     Functional Limitations Info             SPECIAL CARE FACTORS FREQUENCY  PT (By licensed PT), OT (By licensed OT), Speech therapy     PT Frequency: 5x/wk OT Frequency: 5x/wk     Speech Therapy Frequency: 5x/wk      Contractures Contractures Info: Not present    Additional Factors Info  Code Status, Allergies, Insulin Sliding Scale, Isolation Precautions Code Status Info: Full Allergies Info: Beclomethasone, Brimonidine, Codeine,  Darvon Propoxyphene, Iodinated Diagnostic Agents, Iodine-131, Oxycodone, Tranexamic Acid, Ceftriaxone Sodium In Dextrose, Norethindrone, Brimonidine Tartrate, Celecoxib, Oxycontin Oxycodone Hcl   Insulin Sliding Scale Info: 0-9 units every 4 hours Isolation Precautions Info: Droplet precautions     Current Medications (11/25/2018):  This is the current hospital active medication list Current Facility-Administered Medications  Medication Dose Route Frequency Provider Last Rate Last Dose  . 0.9 %  sodium chloride infusion   Intravenous PRN Mariel Aloe, MD   Stopped at 11/24/18 8756  . acetaminophen (TYLENOL) tablet 650 mg  650 mg Oral Q4H PRN Rise Patience, MD       Or  . acetaminophen (TYLENOL) solution 650 mg  650 mg Per Tube Q4H PRN Rise Patience, MD       Or  . acetaminophen (TYLENOL) suppository 650 mg  650 mg Rectal Q4H PRN Rise Patience, MD   650 mg at 11/22/18 0106  . acyclovir (ZOVIRAX) 815 mg in dextrose 5 % 150 mL IVPB  10 mg/kg Intravenous Q8H Rise Patience, MD 166.3 mL/hr at 11/25/18 1158 815 mg at 11/25/18 1158  . aspirin suppository 300 mg  300 mg Rectal Daily Rise Patience, MD   300 mg at 11/23/18 1015   Or  . aspirin tablet 325 mg  325 mg Oral Daily Rise Patience, MD   325 mg at 11/25/18 1042  . insulin aspart (novoLOG) injection 0-9 Units  0-9 Units Subcutaneous Q4H Rise Patience, MD   2  Units at 11/25/18 1305  . mometasone-formoterol (DULERA) 200-5 MCG/ACT inhaler 2 puff  2 puff Inhalation BID Eduard Clos, MD   2 puff at 11/25/18 [id number]  . nystatin (MYCOSTATIN/NYSTOP) topical powder   Topical BID Narda Bonds, MD         Discharge Medications: Please see discharge summary for a list of discharge medications.  Relevant Imaging Results:  Relevant Lab Results:   Additional Information SS#: [id number]  Baldemar Lenis, LCSW

## 2018-11-25 NOTE — Progress Notes (Addendum)
Neurology Progress Note   S:// Patient seen and examined.  No acute events noted overnight.   O:// Current vital signs: BP 138/77 (BP Location: Left Arm)   Pulse 85   Temp 97.9 F (36.6 C) (Oral)   Resp 18   Ht 5\' 1"  (1.549 m)   Wt 81.6 kg   SpO2 100%   BMI 34.01 kg/m  Vital signs in last 24 hours: Temp:  [97.7 F (36.5 C)-98.2 F (36.8 C)] 97.9 F (36.6 C) (10/05 0804) Pulse Rate:  [81-91] 85 (10/05 0804) Resp:  [15-18] 18 (10/05 0804) BP: (124-151)/(60-115) 138/77 (10/05 0804) SpO2:  [96 %-100 %] 100 % (10/05 0851) GENERAL: Awake, alert in NAD HEENT: - Normocephalic and atraumatic, dry mm, no LN++, no Thyromegally LUNGS - Clear to auscultation bilaterally with no wheezes CV - S1S2 RRR, no m/r/g, equal pulses bilaterally. ABDOMEN - Soft, nontender, nondistended with normoactive BS Ext: warm, well perfused, intact peripheral pulses,  Neurological exam Mental status, speech and language: Awake alert follows commands.  Is able to answer simple questions and follow simple commands.  There are occasional paraphasic errors. Cranial nerves: Pupils equal round react light, extraocular movements intact, visual field full, face symmetric-there was earlier question of wax/waning right facial droop. Motor exam: No drift in any of the 4 extremities-no focal weakness noted. Sensory exam: Intact light touch all over Coordination: No dysmetria noted.  Medications  Current Facility-Administered Medications:  .  0.9 %  sodium chloride infusion, , Intravenous, PRN, 12[birth year]-1978, MD, Stopped at 11/24/18 0227 .  acetaminophen (TYLENOL) tablet 650 mg, 650 mg, Oral, Q4H PRN **OR** acetaminophen (TYLENOL) solution 650 mg, 650 mg, Per Tube, Q4H PRN **OR** acetaminophen (TYLENOL) suppository 650 mg, 650 mg, Rectal, Q4H PRN, 01/24/19, MD, 650 mg at 11/22/18 0106 .  acyclovir (ZOVIRAX) 815 mg in dextrose 5 % 150 mL IVPB, 10 mg/kg, Intravenous, Q8H, 01/22/19, MD, Last  Rate: 166.3 mL/hr at 11/25/18 0228, 815 mg at 11/25/18 0228 .  aspirin suppository 300 mg, 300 mg, Rectal, Daily, 300 mg at 11/23/18 1015 **OR** aspirin tablet 325 mg, 325 mg, Oral, Daily, 01/23/19, MD, 325 mg at 11/24/18 1201 .  insulin aspart (novoLOG) injection 0-9 Units, 0-9 Units, Subcutaneous, Q4H, 01/24/19, MD, 1 Units at 11/25/18 0500 .  mometasone-formoterol (DULERA) 200-5 MCG/ACT inhaler 2 puff, 2 puff, Inhalation, BID, 01/25/19, MD, 2 puff at 11/25/18 [phone] .  nystatin (MYCOSTATIN/NYSTOP) topical powder, , Topical, BID, 5366, MD Labs CBC    Component Value Date/Time   WBC 5.9 11/24/2018 0718   RBC 4.26 11/24/2018 0718   HGB 12.7 11/24/2018 0718   HCT 37.8 11/24/2018 0718   PLT 164 11/24/2018 0718   MCV 88.7 11/24/2018 0718   MCH 29.8 11/24/2018 0718   MCHC 33.6 11/24/2018 0718   RDW 12.8 11/24/2018 0718   LYMPHSABS 1.4 11/21/2018 1804   MONOABS 0.7 11/21/2018 1804   EOSABS 0.1 11/21/2018 1804   BASOSABS 0.0 11/21/2018 1804    CMP     Component Value Date/Time   NA 138 11/24/2018 0718   K 3.4 (L) 11/24/2018 0718   CL 104 11/24/2018 0718   CO2 25 11/24/2018 0718   GLUCOSE 173 (H) 11/24/2018 0718   BUN 6 (L) 11/24/2018 0718   CREATININE 0.57 11/24/2018 0718   CALCIUM 8.5 (L) 11/24/2018 0718   PROT 6.0 (L) 11/23/2018 1033   ALBUMIN 2.7 (L) 11/23/2018 1033   AST 38 11/23/2018  1033   ALT 26 11/23/2018 1033   ALKPHOS 96 11/23/2018 1033   BILITOT 0.7 11/23/2018 1033   GFRNONAA >60 11/24/2018 0718   GFRAA >60 11/24/2018 0718    CSF studies with 1 RBC, 1 WBC, 74 glucose and 63 protein. CSF culture sterile for now.  Imaging I have reviewed images in epic and the results pertinent to this consultation are: MRI brain done with and without contrast showed no acute changes, no evidence of stroke, no abnormal enhancement  Overnight EEG artifact is noted but no evidence of seizures on preliminary report.  Formal read  pending.  Assessment: 63 year old woman presenting with altered mental status of unclear etiology that started in the setting of her drinking for the first time in many months.  Spinal tap with no evidence of acute infection. Very unlikely that this is HSV encephalitis would continue acyclovir till the HSV PCR comes negative. If the formal read remains negative for seizures, can discontinue EEG. Imaging not concerning for stroke.  She continues to have some mild paraphasic errors but seems to have improved some from before. I would suspect that her symptoms are related to her alcohol use and she might have had an alcohol use related and/or alcohol withdrawal related seizure.  Recommendations: Await formal EEG read-discontinue if no seizures noted overnight. Continue acyclovir until HSV PCR negative on CSF Continue PT OT and clinical monitoring We will follow.  -- Amie Portland, MD [facility] Pager: [phone] If 7pm to 7am, please call on call as listed on AMION.

## 2018-11-25 NOTE — Progress Notes (Signed)
PROGRESS NOTE    Anna Rojas  GEX:[id number] DOB: 07-28-1955 DOA: 11/21/2018 PCP: Patient, No Pcp Per   Brief Narrative: Anna Rojas is a 63 y.o. female with history of torsades status post ICD placement, history of complete heart block status post pacemaker placement, diabetes mellitus, hypothyroidism. Patient presented secondary to confusion with unknown etiology. Obtunded.   Assessment & Plan:   Principal Problem:   Acute encephalopathy Active Problems:   Uncontrolled type 2 diabetes mellitus with hyperglycemia (HCC)   Hypothyroidism   Acute metabolic encephalopathy   Acute encephalopathy Unknown etiology. Concern for viral etiology with fevers, including meningitis. MRI performed and significant for generalized atrophy. EEG non-specific. LP not very specific but does not suggest bacterial meningitis. -Neurology recommendations: Prolonged EEG; read pending -Acyclovir; Discontinue Vancomycin and Aztreonam -D5 1/2 NS fluids  Hypokalemia -Continue potassium in fluids  Fever Unknown etiology but possibly related to mental status change. procalcitonin is undetectable. No leukocytosis. No recurrent fevers  Diabetes mellitus, type 2 -Continue SSI qAC  Hypothyroidism Patient's TSH is <0.01 on admission. She takes Synthroid 300 mcg as an outpatient. Elevated free T4 with low free T3. Upon chart review, appears patient has been on Synthroid 300 mcg for at least the whole year. -Repeat TSH and likely resume Synthroid in AM  History of torsades Patient is s/p ICD   DVT prophylaxis: SCDs Code Status:   Code Status: Full Code Family Communication: None at bedside Disposition Plan: Discharge pending clinical improvement, neurology recommendations   Consultants:   Neurology  Procedures:   None  Antimicrobials:  Vancomycin  Aztreonam  Acyclovir  Ceftriaxone   Subjective: No concerns today. Wants EEG leads off (thought they were bobby pins)  Objective:  Vitals:   11/25/18 0413 11/25/18 0804 11/25/18 0851 11/25/18 1201  BP: 126/60 138/77  (!) 117/93  Pulse: 87 85  72  Resp: Temp: 97.7 F (36.5 C) 97.9 F (36.6 C)  97.8 F (36.6 C)  TempSrc: Oral Oral  Oral  SpO2: 96% 96% 100% 99%  Weight:      Height:        Intake/Output Summary (Last 24 hours) at 11/25/2018 1307 Last data filed at 11/25/2018 0200 Gross per 24 hour  Intake -  Output 2800 ml  Net -2800 ml   Filed Weights   11/22/18 0230  Weight: 81.6 kg    Examination:  General exam: Appears calm and comfortable Respiratory system: Clear to auscultation. Respiratory effort normal. Cardiovascular system: S1 & S2 heard, RRR. No murmurs, rubs, gallops or clicks. Gastrointestinal system: Abdomen is nondistended, soft and nontender. No organomegaly or masses felt. Normal bowel sounds heard. Central nervous system: Alert and oriented to person. Extremities: No edema. No calf tenderness Skin: No cyanosis. No rashes Psychiatry: Judgement and insight appear impaired.     Data Reviewed: I have personally reviewed following labs and imaging studies  CBC: Recent Labs  Lab 11/21/18 1804 11/21/18 1811 11/22/18 0256 11/24/18 0718  WBC 8.9  --  7.8 5.9  NEUTROABS 6.6  --   --   --   HGB 14.9 15.0 13.8 12.7  HCT 44.4 44.0 41.3 37.8  MCV 87.9  --  88.6 88.7  PLT 224  --  228 164   Basic Metabolic Panel: Recent Labs  Lab 11/21/18 1804 11/21/18 1811 11/22/18 0256 11/23/18 1033 11/24/18 0718  NA 140 139 142 143 138  K 4.5 4.2 3.4* 3.0* 3.4*  CL 105 105 105 107 104  CO2 24  --  23 26 25   GLUCOSE 162* 153* 122* 117* 173*  BUN 9 11 10  6* 6*  CREATININE 0.64 0.50 0.68 0.50 0.57  CALCIUM 8.9  --  8.9 8.6* 8.5*   GFR: Estimated Creatinine Clearance: 70.6 mL/min (by C-G formula based on SCr of 0.57 mg/dL). Liver Function Tests: Recent Labs  Lab 11/21/18 1804 11/22/18 0256 11/23/18 1033  AST 77* 56* 38  ALT 36 31 26  ALKPHOS 142* 126 96  BILITOT 1.1  1.0 0.7  PROT 6.4* 6.2* 6.0*  ALBUMIN 3.0* 2.9* 2.7*   No results for input(s): LIPASE, AMYLASE in the last 168 hours. Recent Labs  Lab 11/21/18 1920  AMMONIA 10   Coagulation Profile: Recent Labs  Lab 11/21/18 1804  INR 1.2   Cardiac Enzymes: No results for input(s): CKTOTAL, CKMB, CKMBINDEX, TROPONINI in the last 168 hours. BNP (last 3 results) No results for input(s): PROBNP in the last 8760 hours. HbA1C: No results for input(s): HGBA1C in the last 72 hours. CBG: Recent Labs  Lab 11/24/18 2044 11/25/18 0039 11/25/18 0448 11/25/18 0759 11/25/18 1137  GLUCAP 179* 109* 145* 119* 188*   Lipid Profile: No results for input(s): CHOL, HDL, LDLCALC, TRIG, CHOLHDL, LDLDIRECT in the last 72 hours. Thyroid Function Tests: Recent Labs    11/22/18 1748  FREET4 1.37*  T3FREE 1.7*   Anemia Panel: No results for input(s): VITAMINB12, FOLATE, FERRITIN, TIBC, IRON, RETICCTPCT in the last 72 hours. Sepsis Labs: Recent Labs  Lab 11/22/18 0256 11/22/18 1027  PROCALCITON <0.10  --   LATICACIDVEN  --  0.8    Recent Results (from the past 240 hour(s))  SARS Coronavirus 2 [facility](Hospital order, [facility]) Nasopharyngeal Nasopharyngeal Swab     Status: None   Collection Time: 11/21/18  6:40 PM   Specimen: Nasopharyngeal Swab  Result Value Ref Range Status   SARS Coronavirus 2 NEGATIVE NEGATIVE Final    Comment: (NOTE) If result is NEGATIVE SARS-CoV-2 target nucleic acids are NOT DETECTED. The SARS-CoV-2 RNA is generally detectable in upper and lower  respiratory specimens during the acute phase of infection. The lowest  concentration of SARS-CoV-2 viral copies this assay can detect is 250  copies / mL. A negative result does not preclude SARS-CoV-2 infection  and should not be used as the sole basis for treatment or other  patient management decisions.  A negative result may occur with  improper specimen collection / handling, submission of specimen  other  than nasopharyngeal swab, presence of viral mutation(s) within the  areas targeted by this assay, and inadequate number of viral copies  (<250 copies / mL). A negative result must be combined with clinical  observations, patient history, and epidemiological information. If result is POSITIVE SARS-CoV-2 target nucleic acids are DETECTED. The SARS-CoV-2 RNA is generally detectable in upper and lower  respiratory specimens dur ing the acute phase of infection.  Positive  results are indicative of active infection with SARS-CoV-2.  Clinical  correlation with patient history and other diagnostic information is  necessary to determine patient infection status.  Positive results do  not rule out bacterial infection or co-infection with other viruses. If result is PRESUMPTIVE POSTIVE SARS-CoV-2 nucleic acids MAY BE PRESENT.   A presumptive positive result was obtained on the submitted specimen  and confirmed on repeat testing.  While 2019 novel coronavirus  (SARS-CoV-2) nucleic acids may be present in the submitted sample  additional confirmatory testing may be necessary for epidemiological  and / or clinical  management purposes  to differentiate between  SARS-CoV-2 and other Sarbecovirus currently known to infect humans.  If clinically indicated additional testing with an alternate test  methodology [phone]) is advised. The SARS-CoV-2 RNA is generally  detectable in upper and lower respiratory sp ecimens during the acute  phase of infection. The expected result is Negative. Fact Sheet for Patients:  StrictlyIdeas.no Fact Sheet for Healthcare Providers: BankingDealers.co.za This test is not yet approved or cleared by the [place] FDA and has been authorized for detection and/or diagnosis of SARS-CoV-2 by FDA under an Emergency Use Authorization (EUA).  This EUA will remain in effect (meaning this test can be used) for the duration  of the COVID-19 declaration under Section 564(b)(1) of the Act, 21 U.S.C. section 360bbb-3(b)(1), unless the authorization is terminated or revoked sooner. Performed at [facility], [place] [address] [place], [place] [address]   Culture, Urine     Status: Abnormal   Collection Time: 11/22/18  1:16 AM   Specimen: Urine, Random  Result Value Ref Range Status   Specimen Description URINE, RANDOM  Final   Special Requests   Final    NONE Performed at [facility], [place] [address] [place], [place] [address]    Culture MULTIPLE SPECIES PRESENT, SUGGEST RECOLLECTION (A)  Final   Report Status 11/22/2018 FINAL  Final  Culture, blood (routine x 2)     Status: None (Preliminary result)   Collection Time: 11/22/18  2:00 AM   Specimen: BLOOD  Result Value Ref Range Status   Specimen Description BLOOD SITE NOT SPECIFIED  Final   Special Requests   Final    BOTTLES DRAWN AEROBIC AND ANAEROBIC Blood Culture adequate volume   Culture   Final    NO GROWTH 3 DAYS Performed at [facility], [place] [address], [place], [place] [address]    Report Status PENDING  Incomplete  Culture, blood (routine x 2)     Status: None (Preliminary result)   Collection Time: 11/22/18  2:15 AM   Specimen: BLOOD  Result Value Ref Range Status   Specimen Description BLOOD SITE NOT SPECIFIED  Final   Special Requests   Final    BOTTLES DRAWN AEROBIC AND ANAEROBIC Blood Culture adequate volume   Culture   Final    NO GROWTH 3 DAYS Performed at [facility], [address], [place], [place] [address]    Report Status PENDING  Incomplete  CSF culture     Status: None (Preliminary result)   Collection Time: 11/23/18  3:57 PM   Specimen: PATH Cytology CSF; Cerebrospinal Fluid  Result Value Ref Range Status   Specimen Description CSF  Final   Special Requests NONE  Final   Gram Stain NO WBC SEEN NO ORGANISMS SEEN CYTOSPIN SMEAR   Final   Culture   Final    NO GROWTH 2 DAYS Performed at  [facility], [address] [place], [place] [address]    Report Status PENDING  Incomplete  Culture, Urine     Status: Abnormal   Collection Time: 11/24/18 12:12 PM   Specimen: Urine, Catheterized  Result Value Ref Range Status   Specimen Description URINE, CATHETERIZED  Final   Special Requests NONE  Final   Culture (A)  Final    <10,000 COLONIES/mL INSIGNIFICANT GROWTH Performed at [facility], [place] [address], [place], [place] [address]    Report Status 11/25/2018 FINAL  Final  Radiology Studies: Dg Fl Guided Lumbar Puncture  Result Date: 11/23/2018 CLINICAL DATA:  Inpatient. Altered mental status. Concern for CNS infection. EXAM: DIAGNOSTIC LUMBAR PUNCTURE UNDER FLUOROSCOPIC GUIDANCE FLUOROSCOPY TIME:  Fluoroscopy Time:  0 minutes 48 seconds Radiation Exposure Index (if provided by the fluoroscopic device): 17.8 mGy Number of Acquired Spot Images: 0 PROCEDURE: Informed consent was obtained from the patient's fiance prior to the procedure, including potential complications of headache, allergy, and pain. With the patient prone, the lower back was prepped with Betadine. 1% Lidocaine was used for local anesthesia. Lumbar puncture was performed at the L2-3 level using a 20 gauge needle with return of clear CSF. Opening pressure could not be obtained due to very slow CSF flow. 8 ml of CSF were obtained for laboratory studies. The patient tolerated the procedure well and there were no apparent complications. IMPRESSION: Technically successful diagnostic lumbar puncture under fluoroscopic guidance. Electronically Signed   By: Delbert Phenix M.D.   On: 11/23/2018 16:09        Scheduled Meds: . aspirin  300 mg Rectal Daily   Or  . aspirin  325 mg Oral Daily  . insulin aspart  0-9 Units Subcutaneous Q4H  . mometasone-formoterol  2 puff Inhalation BID  . nystatin   Topical BID   Continuous Infusions: . sodium chloride Stopped (11/24/18 0227)  . acyclovir 815 mg  (11/25/18 1158)     LOS: 3 days     Jacquelin Hawking, MD [facility] 11/25/2018, 1:07 PM  If 7PM-7AM, please contact night-coverage [email or web address]

## 2018-11-25 NOTE — Progress Notes (Signed)
LTM EEG discontinued - no skin breakdown at unhook.   

## 2018-11-25 NOTE — Procedures (Signed)
Patient Name: Anna Rojas  MRN: [id number]  Epilepsy Attending: Lora Havens  Referring Physician/Provider: Dr Roland Rack Duration: 11/25/2018 1323 to 1212  Patient history: 63 year old female with altered mental status.  EEG to evaluate for subclinical seizures.  Level of alertness: Awake/lethargic  AEDs during EEG study: None  Technical aspects: This EEG study was done with scalp electrodes positioned according to the 10-20 International system of electrode placement. Electrical activity was acquired at a sampling rate of 500Hz  and reviewed with a high frequency filter of 70Hz  and a low frequency filter of 1Hz . EEG data were recorded continuously and digitally stored.   Description: EEG showed continuous generalized 4 to 6 Hz theta slowing.  No clear posterior background rhythm was seen.  After 2300 on 11/24/2018, there was significant myogenic artifact therefore it was difficult to assess for any interictal abnormalities.  However no definite seizure activity was seen. Hyperventilation and photic stimulation were not performed.  Abnormality -Continuous slow, generalized  IMPRESSION: This study is suggestive of moderate diffuse encephalopathy, nonspecific to etiology. No seizures or epileptiform discharges were seen throughout the recording.  Anna Rojas

## 2018-11-25 NOTE — TOC Initial Note (Signed)
Transition of Care [facility]) - Initial/Assessment Note    Patient Details  Name: Anna Rojas MRN: [id number] Date of Birth: 25-Mar-1955  Transition of Care [facility]) CM/SW Contact:    Geralynn Ochs, LCSW Phone Number: 11/25/2018, 3:18 PM  Clinical Narrative:   Patient from home with fiance, who works as a Administrator and is gone during the week. CSW spoke with fiance Herbie Baltimore to discuss discharge plan. Herbie Baltimore says he came up to see her today and she is maybe at about a third of what she usually is at baseline; she knew him and knew that she was at the hospital, but didn't seem to understand anything else that was going on. Herbie Baltimore indicated that she is usually independent at home, although she has been having some mobility issues lately because she's waiting on a knee surgery that has been delayed. She uses a walker at baseline around the house, and a cane when they go out in public. She has not been driving because of the knee pain, but she still handles all of the household bills and cooks and cleans.   CSW discussed recommendation for SNF, and Herbie Baltimore is in agreement. Herbie Baltimore indicated that patient has been at [facility] about 4 months ago for inpatient rehab, and that she did well there; but he would like her to be anywhere that's close to home. Herbie Baltimore is hopeful to also get some help lined up for when she is able to go home, too, because they have supportive friends and neighbors that can check on her and stay with her during the day if need be while he's at work, depending on how she is doing when she returns home. Herbie Baltimore appreciative of CSW assistance.   Robert requested that if he doesn't answer when CSW tries to call, to not leave a voicemail but to send a text and he will call back.                 Expected Discharge Plan: Skilled Nursing Facility Barriers to Discharge: Continued Medical Work up, Ship broker, SNF Pending bed offer, Facility will not accept until restraint criteria  met   Patient Goals and CMS Choice Patient states their goals for this hospitalization and ongoing recovery are:: patient unable to state goals due to altered mental status CMS Medicare.gov Compare Post Acute Care list provided to:: Patient Represenative (must comment) Choice offered to / list presented to : Spouse  Expected Discharge Plan and Services Expected Discharge Plan: [place] Choice: [place] Living arrangements for the past 2 months: Single Family Home                                      Prior Living Arrangements/Services Living arrangements for the past 2 months: Single Family Home Lives with:: Spouse Patient language and need for interpreter reviewed:: No Do you feel safe going back to the place where you live?: Yes      Need for Family Participation in Patient Care: Yes (Comment) Care giver support system in place?: No (comment) Current home services: DME Criminal Activity/Legal Involvement Pertinent to Current Situation/Hospitalization: No - Comment as needed  Activities of Daily Living      Permission Sought/Granted Permission sought to share information with : Facility Sport and exercise psychologist, Family Supports Permission granted to share information with : Yes, Verbal Permission Granted  Share Information with  NAME: Herbie Baltimore  Permission granted to share info w AGENCY: SNF  Permission granted to share info w Relationship: Fiance     Emotional Assessment   Attitude/Demeanor/Rapport: Unable to Assess Affect (typically observed): Unable to Assess Orientation: : Oriented to Self, Oriented to Place Alcohol / Substance Use: Alcohol Use Psych Involvement: No (comment)  Admission diagnosis:  Neck pain [M54.2] Fever [R50.9] Altered mental status, unspecified altered mental status type [R41.82] Acute encephalopathy [G93.40] Patient Active Problem List   Diagnosis Date Noted  . Acute encephalopathy  11/21/2018  . Uncontrolled type 2 diabetes mellitus with hyperglycemia ([place]) 11/21/2018  . Hypothyroidism 11/21/2018  . Acute metabolic encephalopathy 79/48/0165   PCP:  Patient, No Pcp Per Pharmacy:   [id number] [place], [id number]: [phone]: [phone]    Social Determinants of Health (SDOH) Interventions    Readmission Risk Interventions No flowsheet data found.

## 2018-11-26 DIAGNOSIS — R443 Hallucinations, unspecified: Secondary | ICD-10-CM | POA: Diagnosis present

## 2018-11-26 LAB — BASIC METABOLIC PANEL
Anion gap: 12 (ref 5–15)
BUN: 7 mg/dL — ABNORMAL LOW (ref 8–23)
CO2: 24 mmol/L (ref 22–32)
Calcium: 9.1 mg/dL (ref 8.9–10.3)
Chloride: 101 mmol/L (ref 98–111)
Creatinine, Ser: 0.54 mg/dL (ref 0.44–1.00)
GFR calc Af Amer: >60 mL/min (ref >60)
GFR calc non Af Amer: >60 mL/min (ref >60)
Glucose, Bld: 222 mg/dL — ABNORMAL HIGH (ref 70–99)
Potassium: 3.7 mmol/L (ref 3.5–5.1)
Sodium: 137 mmol/L (ref 135–145)

## 2018-11-26 LAB — CBC
HCT: 41.7 % (ref 36.0–46.0)
Hemoglobin: 14.3 g/dL (ref 12.0–15.0)
MCH: 29.5 pg (ref 26.0–34.0)
MCHC: 34.3 g/dL (ref 30.0–36.0)
MCV: 86.2 fL (ref 80.0–100.0)
Platelets: 209 10*3/uL (ref 150–400)
RBC: 4.84 MIL/uL (ref 3.87–5.11)
RDW: 12.8 % (ref 11.5–15.5)
WBC: 8.8 10*3/uL (ref 4.0–10.5)
nRBC: 0 % (ref 0.0–0.2)

## 2018-11-26 LAB — GLUCOSE, CAPILLARY
Glucose-Capillary: 126 mg/dL — ABNORMAL HIGH (ref 70–99)
Glucose-Capillary: 138 mg/dL — ABNORMAL HIGH (ref 70–99)
Glucose-Capillary: 138 mg/dL — ABNORMAL HIGH (ref 70–99)
Glucose-Capillary: 148 mg/dL — ABNORMAL HIGH (ref 70–99)
Glucose-Capillary: 166 mg/dL — ABNORMAL HIGH (ref 70–99)
Glucose-Capillary: 169 mg/dL — ABNORMAL HIGH (ref 70–99)
Glucose-Capillary: 172 mg/dL — ABNORMAL HIGH (ref 70–99)

## 2018-11-26 LAB — T4, FREE: Free T4: 0.9 ng/dL (ref 0.61–1.12)

## 2018-11-26 LAB — TSH: TSH: 0.57 u[IU]/mL (ref 0.350–4.500)

## 2018-11-26 LAB — HSV DNA BY PCR (REFERENCE LAB)
HSV 1 DNA: NEGATIVE
HSV 2 DNA: NEGATIVE

## 2018-11-26 MED ORDER — RISPERIDONE 0.5 MG PO TABS: 0.5000 mg | ORAL_TABLET | Freq: Two times a day (BID) | ORAL | Status: DC

## 2018-11-26 MED ORDER — LORAZEPAM 2 MG/ML IJ SOLN
INTRAMUSCULAR | Status: AC
  Filled 2018-11-26: qty 1

## 2018-11-26 MED ORDER — LEVOTHYROXINE SODIUM 50 MCG PO TABS
250.0000 ug | ORAL_TABLET | Freq: Every day | ORAL | Status: DC
  Administered 2018-11-26 – 2018-11-29 (×4): 250 ug via ORAL
  Filled 2018-11-26 (×4): qty 1

## 2018-11-26 MED ORDER — QUETIAPINE FUMARATE 25 MG PO TABS
12.5000 mg | ORAL_TABLET | Freq: Every day | ORAL | Status: DC
  Administered 2018-11-26: 12.5 mg via ORAL
  Filled 2018-11-26: qty 1

## 2018-11-26 MED ORDER — LORAZEPAM 2 MG/ML IJ SOLN
0.5000 mg | Freq: Once | INTRAMUSCULAR | Status: AC
  Administered 2018-11-26: 0.5 mg via INTRAVENOUS

## 2018-11-26 NOTE — Progress Notes (Signed)
PROGRESS NOTE    Anna ShipMelissa Taher  ZOX:[id number] DOB: 12-28-1955 DOA: 11/21/2018 PCP: Patient, No Pcp Per   Brief Narrative: Anna Rojas is a 63 y.o. female with history of torsades status post ICD placement, history of complete heart block status post pacemaker placement, diabetes mellitus, hypothyroidism. Patient presented secondary to confusion with unknown etiology. Obtunded.   Assessment & Plan:   Principal Problem:   Acute encephalopathy Active Problems:   Uncontrolled type 2 diabetes mellitus with hyperglycemia (HCC)   Hypothyroidism   Acute metabolic encephalopathy   Acute encephalopathy Unknown etiology. Concern for viral etiology with fevers, including meningitis however labs and culture do not suggest infection. MRI performed and significant for generalized atrophy. EEG non-specific with diffuse slowing. Antibiotics and acyclovir discontinued. Unsure if there is a behavioral component to this. -Neurology recommendations pending today -D5 1/2 NS fluids -Psych consult at this point  Hypokalemia -Continue potassium in fluids  Fever Unknown etiology but possibly related to mental status change. procalcitonin is undetectable. No leukocytosis. No recurrent fevers. Blood cultures with no growth. CSF cultures without evidence of infection to date  Diabetes mellitus, type 2 -Continue SSI qAC  Hypothyroidism Patient's TSH is <0.01 on admission. She takes Synthroid 300 mcg as an outpatient. Elevated free T4 with low free T3. Upon chart review, appears patient has been on Synthroid 300 mcg for at least the whole year; seems unlikely that current dose would be an issue. Possible patient has been taking more than prescribed but cannot confirm -Repeat TSH, free T4 -Restart Synthroid at reduced dose of 250 mcg for now. If able to get better information, this may need to be adjusted prior to discharge.  History of torsades Patient is s/p ICD   DVT prophylaxis: SCDs Code Status:    Code Status: Full Code Family Communication: None at bedside Disposition Plan: Discharge pending clinical improvement, neurology recommendations   Consultants:   Neurology  Procedures:   LP  Antimicrobials:  Vancomycin  Aztreonam  Acyclovir  Ceftriaxone   Subjective: No concerns. Not very coherent in speech  Objective: Vitals:   11/25/18 1631 11/25/18 2013 11/25/18 2116 11/26/18 0805  BP: 138/82 (!) 143/81  (!) 153/100  Pulse: 75 96 99 (!) 109  Resp: 17 18 18 17   Temp: 97.6 F (36.4 C) 98.1 F (36.7 C)  98.6 F (37 C)  TempSrc: Oral Oral  Oral  SpO2: 99% 100% 99% 90%  Weight:      Height:        Intake/Output Summary (Last 24 hours) at 11/26/2018 1021 Last data filed at 11/25/2018 1500 Gross per 24 hour  Intake 200 ml  Output -  Net 200 ml   Filed Weights   11/22/18 0230  Weight: 81.6 kg    Examination:  General exam: Appears calm and comfortable Respiratory system: Clear to auscultation. Respiratory effort normal. Cardiovascular system: S1 & S2 heard, RRR. No murmurs, rubs, gallops or clicks. Gastrointestinal system: Abdomen is nondistended, soft and nontender. No organomegaly or masses felt. Normal bowel sounds heard. Central nervous system: Alert and oriented to person. No focal neurological deficits. Extremities: No edema. No calf tenderness Skin: No cyanosis. No rashes Psychiatry: Judgement and insight appear impaired. Speech is disorganized    Data Reviewed: I have personally reviewed following labs and imaging studies  CBC: Recent Labs  Lab 11/21/18 1804 11/21/18 1811 11/22/18 0256 11/24/18 0718  WBC 8.9  --  7.8 5.9  NEUTROABS 6.6  --   --   --   HGB  14.9 15.0 13.8 12.7  HCT 44.4 44.0 41.3 37.8  MCV 87.9  --  88.6 88.7  PLT 224  --  228 164   Basic Metabolic Panel: Recent Labs  Lab 11/21/18 1804 11/21/18 1811 11/22/18 0256 11/23/18 1033 11/24/18 0718  NA 140 139 142 143 138  K 4.5 4.2 3.4* 3.0* 3.4*  CL 105 105 105  107 104  CO2 24  --  GLUCOSE 162* 153* 122* 117* 173*  BUN 6* 6*  CREATININE 0.64 0.50 0.68 0.50 0.57  CALCIUM 8.9  --  8.9 8.6* 8.5*   GFR: Estimated Creatinine Clearance: 70.6 mL/min (by C-G formula based on SCr of 0.57 mg/dL). Liver Function Tests: Recent Labs  Lab 11/21/18 1804 11/22/18 0256 11/23/18 1033  AST 77* 56* 38  ALT 36 31 26  ALKPHOS 142* 126 96  BILITOT 1.1 1.0 0.7  PROT 6.4* 6.2* 6.0*  ALBUMIN 3.0* 2.9* 2.7*   No results for input(s): LIPASE, AMYLASE in the last 168 hours. Recent Labs  Lab 11/21/18 1920  AMMONIA 10   Coagulation Profile: Recent Labs  Lab 11/21/18 1804  INR 1.2   Cardiac Enzymes: No results for input(s): CKTOTAL, CKMB, CKMBINDEX, TROPONINI in the last 168 hours. BNP (last 3 results) No results for input(s): PROBNP in the last 8760 hours. HbA1C: No results for input(s): HGBA1C in the last 72 hours. CBG: Recent Labs  Lab 11/25/18 1627 11/25/18 2015 11/26/18 0026 11/26/18 0524 11/26/18 0806  GLUCAP 253* 94 138* 172* 169*   Lipid Profile: No results for input(s): CHOL, HDL, LDLCALC, TRIG, CHOLHDL, LDLDIRECT in the last 72 hours. Thyroid Function Tests: No results for input(s): TSH, T4TOTAL, FREET4, T3FREE, THYROIDAB in the last 72 hours. Anemia Panel: No results for input(s): VITAMINB12, FOLATE, FERRITIN, TIBC, IRON, RETICCTPCT in the last 72 hours. Sepsis Labs: Recent Labs  Lab 11/22/18 0256 11/22/18 1027  PROCALCITON <0.10  --   LATICACIDVEN  --  0.8    Recent Results (from the past 240 hour(s))  SARS Coronavirus 2 [facility] order, Performed in [facility] hospital lab) Nasopharyngeal Nasopharyngeal Swab     Status: None   Collection Time: 11/21/18  6:40 PM   Specimen: Nasopharyngeal Swab  Result Value Ref Range Status   SARS Coronavirus 2 NEGATIVE NEGATIVE Final    Comment: (NOTE) If result is NEGATIVE SARS-CoV-2 target nucleic acids are NOT DETECTED. The SARS-CoV-2 RNA is generally detectable  in upper and lower  respiratory specimens during the acute phase of infection. The lowest  concentration of SARS-CoV-2 viral copies this assay can detect is 250  copies / mL. A negative result does not preclude SARS-CoV-2 infection  and should not be used as the sole basis for treatment or other  patient management decisions.  A negative result may occur with  improper specimen collection / handling, submission of specimen other  than nasopharyngeal swab, presence of viral mutation(s) within the  areas targeted by this assay, and inadequate number of viral copies  (<250 copies / mL). A negative result must be combined with clinical  observations, patient history, and epidemiological information. If result is POSITIVE SARS-CoV-2 target nucleic acids are DETECTED. The SARS-CoV-2 RNA is generally detectable in upper and lower  respiratory specimens dur ing the acute phase of infection.  Positive  results are indicative of active infection with SARS-CoV-2.  Clinical  correlation with patient history and other diagnostic information is  necessary to determine patient infection status.  Positive results  do  not rule out bacterial infection or co-infection with other viruses. If result is PRESUMPTIVE POSTIVE SARS-CoV-2 nucleic acids MAY BE PRESENT.   A presumptive positive result was obtained on the submitted specimen  and confirmed on repeat testing.  While 2019 novel coronavirus  (SARS-CoV-2) nucleic acids may be present in the submitted sample  additional confirmatory testing may be necessary for epidemiological  and / or clinical management purposes  to differentiate between  SARS-CoV-2 and other Sarbecovirus currently known to infect humans.  If clinically indicated additional testing with an alternate test  methodology [phone]) is advised. The SARS-CoV-2 RNA is generally  detectable in upper and lower respiratory sp ecimens during the acute  phase of infection. The expected result is  Negative. Fact Sheet for Patients:  BoilerBrush.com.cy Fact Sheet for Healthcare Providers: https://pope.com/ This test is not yet approved or cleared by the [place] FDA and has been authorized for detection and/or diagnosis of SARS-CoV-2 by FDA under an Emergency Use Authorization (EUA).  This EUA will remain in effect (meaning this test can be used) for the duration of the COVID-19 declaration under Section 564(b)(1) of the Act, 21 U.S.C. section 360bbb-3(b)(1), unless the authorization is terminated or revoked sooner. Performed at [facility], [address] [place], [place] [address]   Culture, Urine     Status: Abnormal   Collection Time: 11/22/18  1:16 AM   Specimen: Urine, Random  Result Value Ref Range Status   Specimen Description URINE, RANDOM  Final   Special Requests   Final    NONE Performed at [facility], [address], [place], [place] [address]    Culture MULTIPLE SPECIES PRESENT, SUGGEST RECOLLECTION (A)  Final   Report Status 11/22/2018 FINAL  Final  Culture, blood (routine x 2)     Status: None (Preliminary result)   Collection Time: 11/22/18  2:00 AM   Specimen: BLOOD  Result Value Ref Range Status   Specimen Description BLOOD SITE NOT SPECIFIED  Final   Special Requests   Final    BOTTLES DRAWN AEROBIC AND ANAEROBIC Blood Culture adequate volume   Culture   Final    NO GROWTH 3 DAYS Performed at [facility], [address], [place], [place] [address]    Report Status PENDING  Incomplete  Culture, blood (routine x 2)     Status: None (Preliminary result)   Collection Time: 11/22/18  2:15 AM   Specimen: BLOOD  Result Value Ref Range Status   Specimen Description BLOOD SITE NOT SPECIFIED  Final   Special Requests   Final    BOTTLES DRAWN AEROBIC AND ANAEROBIC Blood Culture adequate volume   Culture   Final    NO GROWTH 3 DAYS Performed at [facility], [address] [place], [place] [address]    Report Status PENDING  Incomplete  CSF culture     Status: None (Preliminary result)   Collection Time: 11/23/18  3:57 PM   Specimen: PATH Cytology CSF; Cerebrospinal Fluid  Result Value Ref Range Status   Specimen Description CSF  Final   Special Requests NONE  Final   Gram Stain NO WBC SEEN NO ORGANISMS SEEN CYTOSPIN SMEAR   Final   Culture   Final    NO GROWTH 2 DAYS Performed at [facility], [address], [place], [place] [address]    Report Status PENDING  Incomplete  Culture, Urine     Status: Abnormal  Collection Time: 11/24/18 12:12 PM   Specimen: Urine, Catheterized  Result Value Ref Range Status   Specimen Description URINE, CATHETERIZED  Final   Special Requests NONE  Final   Culture (A)  Final    <10,000 COLONIES/mL INSIGNIFICANT GROWTH Performed at [facility], [address], [place], [place] [address]    Report Status 11/25/2018 FINAL  Final         Radiology Studies: No results found.      Scheduled Meds: . aspirin  300 mg Rectal Daily   Or  . aspirin  325 mg Oral Daily  . insulin aspart  0-9 Units Subcutaneous Q4H  . mometasone-formoterol  2 puff Inhalation BID  . nystatin   Topical BID   Continuous Infusions: . sodium chloride Stopped (11/24/18 0227)     LOS: 4 days     Cordelia Poche, MD [facility] 11/26/2018, 10:21 AM  If 7PM-7AM, please contact night-coverage [email or web address]

## 2018-11-26 NOTE — Progress Notes (Signed)
Physical Therapy Treatment Patient Details Name: Viviana Trimble MRN: [id number] DOB: 07-11-1955 Today's Date: 11/26/2018    History of Present Illness Kyarra Vancamp is a 63 y.o. female with history of torsades status post ICD placement, history of complete heart block status post pacemaker placement, diabetes mellitus, hypothyroidism was brought to the ER patient was found to be confused by patient's family. Pt with history of alcohol abuse but has been sober for 6 months until she had 7 beers 2 days ago. Pt found to have encephalopathy, Underwent LP to rule out meningitis.    PT Comments    Pt confused upon therapist's entry into room stating that there is someone else in the room with a different name from herself. Pt soiled of urine and bowel. Worked on sitting EOB with goal of standing for clean up but pt with strong, active posterior lean and posterior pelvic tilt making it unsafe for her to stay EOB. Returned to supine for clean up and then sat up again to promote trunk flexion and abdominal activation. She could activate for a short period of time with fwd reaching activities but quickly returned to posterior lean. Continue to recommend 24/7 supervision at d/c, SNF level care. PT will continue to follow.    Follow Up Recommendations  SNF;Supervision/Assistance - 24 hour     Equipment Recommendations  Rolling walker with 5" wheels;3in1 (PT)    Recommendations for Other Services       Precautions / Restrictions Precautions Precautions: Fall Precaution Comments: confused, garbled speech at times Restrictions Weight Bearing Restrictions: No    Mobility  Bed Mobility Overal bed mobility: Needs Assistance Bed Mobility: Supine to Sit;Sit to Supine     Supine to sit: Mod assist;+2 for physical assistance Sit to supine: Mod assist;+2 for physical assistance   General bed mobility comments: pt cued to roll but slides legs off bed and then has difficulty with abdominal initiation. Mod  A to come to sitting but pt with strong posterior lean. When attempting to shift hips to EOB, pt continues to thrust hips fwd with posterior pelvic tilt which would have led to her sliding off bed. Returned to supine for bowel clean up and then attempted sitting again with much the same result. Pt able to activate trunk flexion with reaching activities but otherwise leaning posterior  Transfers                 General transfer comment: unsafe to attempt today due to active posterior lean in sitting  Ambulation/Gait             General Gait Details: unable   Stairs             Wheelchair Mobility    Modified Rankin (Stroke Patients Only)       Balance Overall balance assessment: Needs assistance Sitting-balance support: Feet supported;No upper extremity supported Sitting balance-Leahy Scale: Zero Sitting balance - Comments: signficant posterior lean without ability to self correct requiring assist to maintain midline Postural control: Posterior lean                                  Cognition Arousal/Alertness: Awake/alert Behavior During Therapy: Restless Overall Cognitive Status: Impaired/Different from baseline Area of Impairment: Orientation;Attention;Memory;Following commands;Safety/judgement;Awareness;Problem solving                 Orientation Level: Disoriented to;Time;Situation;Place Current Attention Level: Focused Memory: Decreased short-term memory Following Commands: Follows  one step commands with increased time;Follows one step commands inconsistently Safety/Judgement: Decreased awareness of safety;Decreased awareness of deficits Awareness: Emergent Problem Solving: Slow processing;Decreased initiation;Requires tactile cues;Requires verbal cues;Difficulty sequencing General Comments: pt distracted by the need to urinate and doing so in the bed. Not following instructional commands      Exercises      General Comments         Pertinent Vitals/Pain Pain Assessment: No/denies pain    Home Living                      Prior Function            PT Goals (current goals can now be found in the care plan section) Acute Rehab PT Goals Patient Stated Goal: none stated PT Goal Formulation: Patient unable to participate in goal setting Time For Goal Achievement: 12/08/18 Potential to Achieve Goals: Fair Progress towards PT goals: Not progressing toward goals - comment    Frequency    Min 2X/week      PT Plan Current plan remains appropriate;Frequency needs to be updated    Co-evaluation              AM-PAC PT "6 Clicks" Mobility   Outcome Measure  Help needed turning from your back to your side while in a flat bed without using bedrails?: A Lot Help needed moving from lying on your back to sitting on the side of a flat bed without using bedrails?: A Lot Help needed moving to and from a bed to a chair (including a wheelchair)?: Total Help needed standing up from a chair using your arms (e.g., wheelchair or bedside chair)?: Total Help needed to walk in hospital room?: Total Help needed climbing 3-5 steps with a railing? : Total 6 Click Score: 8    End of Session Equipment Utilized During Treatment: Gait belt Activity Tolerance: Patient tolerated treatment well Patient left: in bed;with call bell/phone within reach;with nursing/sitter in room Nurse Communication: Mobility status PT Visit Diagnosis: Unsteadiness on feet (R26.81);Muscle weakness (generalized) (M62.81);Difficulty in walking, not elsewhere classified (R26.2)     Time: 1140-1201 PT Time Calculation (min) (ACUTE ONLY): 21 min  Charges:  $Therapeutic Activity: 8-22 mins                     Leighton Roach, [place]  Pager [phone] Office [place] 11/26/2018, 1:41 PM

## 2018-11-26 NOTE — Progress Notes (Signed)
Neurology Progress Note   S:// No acute changes overnight but continues to have difficulty with words.  O:// Current vital signs: BP (!) 153/100 (BP Location: Right Arm)   Pulse (!) 109   Temp 98.6 F (37 C) (Oral)   Resp 17   Ht 5\' 1"  (1.549 m)   Wt 81.6 kg   SpO2 90%   BMI 34.01 kg/m  Vital signs in last 24 hours: Temp:  [97.6 F (36.4 C)-98.6 F (37 C)] 98.6 F (37 C) (10/06 0805) Pulse Rate:  [72-109] 109 (10/06 0805) Resp:  [17-18] 17 (10/06 0805) BP: (117-153)/(81-100) 153/100 (10/06 0805) SpO2:  [90 %-100 %] 90 % (10/06 0805) GENERAL: Awake, alert in NAD HEENT: - Normocephalic and atraumatic, dry mm, no LN++, no Thyromegally LUNGS - Clear to auscultation bilaterally with no wheezes CV - S1S2 RRR, no m/r/g, equal pulses bilaterally. ABDOMEN - Soft, nontender, nondistended with normoactive BS Ext: warm, well perfused, intact peripheral pulses, no edema NEURO:  Mental Status: Awake, alert, not oriented to place or time. Oriented to persn Language: speech is dysarthric mildly.  She makes multiple paraphasic errors and has a word salad at times, and at times can talk without a problem. Cranial nerves: Pupils equal round react light, extraocular movements intact, no facial asymmetry, facial sensation intact, tongue uvula midline. Motor exam: Antigravity in both upper extremities.  Also antigravity in both lower extremities with exam is limited today by some pain. Sensory exam: Intact to light touch Coordination: No dysmetria   Medications  Current Facility-Administered Medications:  .  0.9 %  sodium chloride infusion, , Intravenous, PRN, 01-31-1997, MD, Stopped at 11/24/18 0227 .  acetaminophen (TYLENOL) tablet 650 mg, 650 mg, Oral, Q4H PRN **OR** acetaminophen (TYLENOL) solution 650 mg, 650 mg, Per Tube, Q4H PRN **OR** acetaminophen (TYLENOL) suppository 650 mg, 650 mg, Rectal, Q4H PRN, 01/24/19, MD, 650 mg at 11/22/18 0106 .  aspirin suppository 300  mg, 300 mg, Rectal, Daily, 300 mg at 11/23/18 1015 **OR** aspirin tablet 325 mg, 325 mg, Oral, Daily, 01/23/19, MD, 325 mg at 11/25/18 1042 .  insulin aspart (novoLOG) injection 0-9 Units, 0-9 Units, Subcutaneous, Q4H, 01/25/19, MD, 2 Units at 11/26/18 [phone] .  mometasone-formoterol (DULERA) 200-5 MCG/ACT inhaler 2 puff, 2 puff, Inhalation, BID, 4827, MD, 2 puff at 11/25/18 2114 .  nystatin (MYCOSTATIN/NYSTOP) topical powder, , Topical, BID, 2115, MD Labs CBC    Component Value Date/Time   WBC 5.9 11/24/2018 0718   RBC 4.26 11/24/2018 0718   HGB 12.7 11/24/2018 0718   HCT 37.8 11/24/2018 0718   PLT 164 11/24/2018 0718   MCV 88.7 11/24/2018 0718   MCH 29.8 11/24/2018 0718   MCHC 33.6 11/24/2018 0718   RDW 12.8 11/24/2018 0718   LYMPHSABS 1.4 11/21/2018 1804   MONOABS 0.7 11/21/2018 1804   EOSABS 0.1 11/21/2018 1804   BASOSABS 0.0 11/21/2018 1804    CMP     Component Value Date/Time   NA 138 11/24/2018 0718   K 3.4 (L) 11/24/2018 0718   CL 104 11/24/2018 0718   CO2 25 11/24/2018 0718   GLUCOSE 173 (H) 11/24/2018 0718   BUN 6 (L) 11/24/2018 0718   CREATININE 0.57 11/24/2018 0718   CALCIUM 8.5 (L) 11/24/2018 0718   PROT 6.0 (L) 11/23/2018 1033   ALBUMIN 2.7 (L) 11/23/2018 1033   AST 38 11/23/2018 1033   ALT 26 11/23/2018 1033   ALKPHOS 96 11/23/2018 1033  BILITOT 0.7 11/23/2018 1033   GFRNONAA >60 11/24/2018 0718   GFRAA >60 11/24/2018 0718    glycosylated hemoglobin  Lipid Panel     Component Value Date/Time   CHOL 150 11/22/2018 0257   TRIG 128 11/22/2018 0257   HDL 39 (L) 11/22/2018 0257   CHOLHDL 3.8 11/22/2018 0257   VLDL 26 11/22/2018 0257   LDLCALC 85 11/22/2018 0257     Imaging I have reviewed images in epic and the results pertinent to this consultation are: MRI brain with and without contrast with no enhancement, no acute changes, no stroke.  Assessment: 63 year old presenting with altered mental  status of unclear etiology in setting of her having a first drink after many months of abstaining.  Does have a prior history of alcoholism but had been sober for 9 months before starting to drink again. Concern for CNS infection- CSF studies do not support infectious etiology.  CSF HSV PCR is also negative. Long-term EEG did not show any seizure or seizure-like activity.  Today she had more paraphasic errors and word salad than I had seen yesterday.  She had had similar presentations before from my partner who had seen her in consultation-he was initially concerned for stroke, of which is now ruled out with the MRI and the next thought process was that this might be off-and-on seizures, which none have been caught on long-term EEG.  I suspect that her current presentation is related to long-term alcohol abuse and likely Wernicke's encephalopathy.   Impression: Likely Wernicke's encephalopathy Less likely CNS infection including HSV encephalitis  Recommendations: Continue aggressive thiamine replacement Continue PT OT Acyclovir can be discontinued  -- Amie Portland, MD [facility] Pager: [phone] If 7pm to 7am, please call on call as listed on AMION.

## 2018-11-26 NOTE — Consult Note (Signed)
[facility]to[facility]Face [facility] Consult   Reason for Consult:  hallucinations Referring Physician:  Dr Caleb PoppNettey Patient Identification: Anna ShipMelissa Rojas MRN:  [id number] Principal Diagnosis: Acute encephalopathy Diagnosis:  Principal Problem:   Acute encephalopathy Active Problems:   Hallucinations   Uncontrolled type 2 diabetes mellitus with hyperglycemia (HCC)   Hypothyroidism   Acute metabolic encephalopathy  Total Time spent with patient: 1 hour  Subjective:   Anna Rojas is a 63 y.o. female patient admitted with confusion and cardiac issues.  Patient seen and evaluated in person by this provider.  She was admitted for confusion along with cardiac and stroke concerns.  On assessment she is anxious and hallucinating, visual and auditory hallucinations present.  Apparently she started drinking 2 days prior to admission after not drinking for 6 months.  Cerebrospinal fluid is being tested for herpes at this time.  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  Based on her cardiac history, Haldol low dose BID recommended as it has minimal to no effect on QTc  prolongation in older acutely hospitalised patients.  Haldol IV should be avoided as this route does increase incidence.  HPI per MD:  Anna Rojas is a 63 y.o. female with history of torsades status post ICD placement, history of complete heart block status post pacemaker placement, diabetes mellitus, hypothyroidism was brought to the ER patient was found to be confused by patient's family.  As per patient's fancy patient last spoke to him around November 16, 2018.  Patient also had communication with family around Tuesday that is November 19, 2018.  Since patient was not able to be reached by patient's fianc patient's friend to call of his brother to check on the patient.  Patient was found to be negative on the bed confused at her home.  Was brought to the ER as a code stroke.  As per the report patient usually drinks alcohol  heavily but has not been had any alcohol for last 6 months and started drinking again 2 days ago.  Patient has had 7 beers a day ago.  Past Psychiatric History: alcohol dependence, anxiety  Risk to Self:  not suicidal Risk to Others:  none Prior Inpatient Therapy:  none Prior Outpatient Therapy:  none  Past Medical History:  Past Medical History:  Diagnosis Date  . Diabetes mellitus without complication (HCC)   . Hypothyroidism     Past Surgical History:  Procedure Laterality Date  . CARDIAC DEFIBRILLATOR PLACEMENT    . PACEMAKER PLACEMENT     Family History:  Family History  Problem Relation Age of Onset  . Brain cancer Mother   . Lung cancer Sister    Family Psychiatric  History: see above Social History:  Social History   Substance and Sexual Activity  Alcohol Use None     Social History   Substance and Sexual Activity  Drug Use Not on file    Social History   Socioeconomic History  . Marital status: Divorced    Spouse name: Not on file  . Number of children: Not on file  . Years of education: Not on file  . Highest education level: Not on file  Occupational History  . Not on file  Social Needs  . Financial resource strain: Not on file  . Food insecurity    Worry: Not on file    Inability: Not on file  . Transportation needs    Medical: Not on file    Non-medical: Not on file  Tobacco Use  .  Smoking status: Current Every Day Smoker  . Smokeless tobacco: Never Used  Substance and Sexual Activity  . Alcohol use: Not on file  . Drug use: Not on file  . Sexual activity: Not on file  Lifestyle  . Physical activity    Days per week: Not on file    Minutes per session: Not on file  . Stress: Not on file  Relationships  . Social Musician on phone: Not on file    Gets together: Not on file    Attends religious service: Not on file    Active member of club or organization: Not on file    Attends meetings of clubs or organizations: Not on  file    Relationship status: Not on file  Other Topics Concern  . Not on file  Social History Narrative  . Not on file   Additional Social History:    Allergies:   Allergies  Allergen Reactions  . Beclomethasone Itching, Rash and Other (See Comments)    Flushing skin redness, also  . Brimonidine Hives and Itching  . Codeine Hives and Rash  . Darvon [Propoxyphene] Hives  . Iodinated Diagnostic Agents Hives  . Iodine-131 Hives  . Oxycodone Hives and Rash  . Tranexamic Acid Itching and Rash  . Ceftriaxone Sodium In Dextrose Rash    12/29/17 Developed diffuse pruritic rash on chest, face, hips, arms after finishing rocephin dose.  Patient did have rocephin in 2017 without a problem though...  11/22/2018 - unable to tolerate retrial of ceftriaxone, rash developed after dose.   . Norethindrone Rash  . Brimonidine Tartrate Hives and Itching  . Celecoxib Itching and Rash  . Oxycontin [Oxycodone Hcl] Hives and Rash    Labs:  Results for orders placed or performed during the hospital encounter of 11/21/18 (from the past 48 hour(s))  Glucose, capillary     Status: Abnormal   Collection Time: 11/24/18  8:44 PM  Result Value Ref Range   Glucose-Capillary 179 (H) 70 - 99 mg/dL   Comment 1 Notify RN    Comment 2 Document in Chart   Glucose, capillary     Status: Abnormal   Collection Time: 11/25/18 12:39 AM  Result Value Ref Range   Glucose-Capillary 109 (H) 70 - 99 mg/dL   Comment 1 Notify RN    Comment 2 Document in Chart   Glucose, capillary     Status: Abnormal   Collection Time: 11/25/18  4:48 AM  Result Value Ref Range   Glucose-Capillary 145 (H) 70 - 99 mg/dL   Comment 1 Notify RN    Comment 2 Document in Chart   Glucose, capillary     Status: Abnormal   Collection Time: 11/25/18  7:59 AM  Result Value Ref Range   Glucose-Capillary 119 (H) 70 - 99 mg/dL  Glucose, capillary     Status: Abnormal   Collection Time: 11/25/18 11:37 AM  Result Value Ref Range    Glucose-Capillary 188 (H) 70 - 99 mg/dL  Glucose, capillary     Status: Abnormal   Collection Time: 11/25/18  4:27 PM  Result Value Ref Range   Glucose-Capillary 253 (H) 70 - 99 mg/dL  Glucose, capillary     Status: None   Collection Time: 11/25/18  8:15 PM  Result Value Ref Range   Glucose-Capillary 94 70 - 99 mg/dL  Glucose, capillary     Status: Abnormal   Collection Time: 11/26/18 12:26 AM  Result Value Ref Range  Glucose-Capillary 138 (H) 70 - 99 mg/dL   Comment 1 Notify RN    Comment 2 Document in Chart   Glucose, capillary     Status: Abnormal   Collection Time: 11/26/18  5:24 AM  Result Value Ref Range   Glucose-Capillary 172 (H) 70 - 99 mg/dL  Glucose, capillary     Status: Abnormal   Collection Time: 11/26/18  8:06 AM  Result Value Ref Range   Glucose-Capillary 169 (H) 70 - 99 mg/dL  CBC     Status: None   Collection Time: 11/26/18  9:55 AM  Result Value Ref Range   WBC 8.8 4.0 - 10.5 K/uL   RBC 4.84 3.87 - 5.11 MIL/uL   Hemoglobin 14.3 12.0 - 15.0 g/dL   HCT 27.2 53.6 - 64.4 %   MCV 86.2 80.0 - 100.0 fL   MCH 29.5 26.0 - 34.0 pg   MCHC 34.3 30.0 - 36.0 g/dL   RDW 03.4 74.2 - 59.5 %   Platelets 209 150 - 400 K/uL   nRBC 0.0 0.0 - 0.2 %    Comment: Performed at [facility], [address], [place], [place] [address]  Basic metabolic panel     Status: Abnormal   Collection Time: 11/26/18  9:55 AM  Result Value Ref Range   Sodium 137 135 - 145 mmol/L   Potassium 3.7 3.5 - 5.1 mmol/L   Chloride 101 98 - 111 mmol/L   CO2 24 22 - 32 mmol/L   Glucose, Bld 222 (H) 70 - 99 mg/dL   BUN 7 (L) 8 - 23 mg/dL   Creatinine, Ser 6.43 0.44 - 1.00 mg/dL   Calcium 9.1 8.9 - 32.9 mg/dL   GFR calc non Af Amer >60 >60 mL/min   GFR calc Af Amer >60 >60 mL/min   Anion gap 12 5 - 15    Comment: Performed at [facility], [address] [place], [place] [address]  TSH     Status: None   Collection Time: 11/26/18  9:55 AM  Result Value Ref Range   TSH 0.570 0.350 -  4.500 uIU/mL    Comment: Performed by a 3rd Generation assay with a functional sensitivity of <=0.01 uIU/mL. Performed at [facility], [address] [place], [place] [address]   T4, free     Status: None   Collection Time: 11/26/18  9:55 AM  Result Value Ref Range   Free T4 0.90 0.61 - 1.12 ng/dL    Comment: (NOTE) Biotin ingestion may interfere with free T4 tests. If the results are inconsistent with the TSH level, previous test results, or the clinical presentation, then consider biotin interference. If needed, order repeat testing after stopping biotin. Performed at [facility], [address], [place], [place] [address]   Glucose, capillary     Status: Abnormal   Collection Time: 11/26/18 11:58 AM  Result Value Ref Range   Glucose-Capillary 148 (H) 70 - 99 mg/dL  Glucose, capillary     Status: Abnormal   Collection Time: 11/26/18  4:15 PM  Result Value Ref Range   Glucose-Capillary 166 (H) 70 - 99 mg/dL    Current Facility-Administered Medications  Medication Dose Route Frequency Provider Last Rate Last Dose  . 0.9 %  sodium chloride infusion   Intravenous PRN Narda Bonds, MD   Stopped at 11/24/18 1093  . acetaminophen (TYLENOL) tablet 650 mg  650 mg Oral Q4H PRN Eduard Clos, MD  Or  . acetaminophen (TYLENOL) solution 650 mg  650 mg Per Tube Q4H PRN Rise Patience, MD       Or  . acetaminophen (TYLENOL) suppository 650 mg  650 mg Rectal Q4H PRN Rise Patience, MD   650 mg at 11/22/18 0106  . aspirin suppository 300 mg  300 mg Rectal Daily Rise Patience, MD   300 mg at 11/23/18 1015   Or  . aspirin tablet 325 mg  325 mg Oral Daily Rise Patience, MD   325 mg at 11/26/18 1021  . insulin aspart (novoLOG) injection 0-9 Units  0-9 Units Subcutaneous Q4H Rise Patience, MD   2 Units at 11/26/18 1653  . levothyroxine (SYNTHROID) tablet 250 mcg  250 mcg Oral Q0600 Mariel Aloe, MD   250 mcg at 11/26/18 1233  .  mometasone-formoterol (DULERA) 200-5 MCG/ACT inhaler 2 puff  2 puff Inhalation BID Rise Patience, MD   2 puff at 11/25/18 2114  . nystatin (MYCOSTATIN/NYSTOP) topical powder   Topical BID Mariel Aloe, MD      . risperiDONE (RISPERDAL) tablet 0.5 mg  0.5 mg Oral BID Patrecia Pour, NP        Musculoskeletal: Strength & Muscle Tone: decreased Gait & Station: did not witness Patient leans: N/A  Psychiatric Specialty Exam: Physical Exam  Nursing note and vitals reviewed. Constitutional: She appears well-developed and well-nourished.  HENT:  Head: Normocephalic.  Neck: Normal range of motion.  Respiratory: Effort normal.  Neurological: She is alert.  Psychiatric: Thought content normal. Her mood appears anxious. Her speech is rapid and/or pressured. She is actively hallucinating. Cognition and memory are impaired. She expresses impulsivity and inappropriate judgment.    Review of Systems  Psychiatric/Behavioral: Positive for hallucinations and memory loss. The patient is nervous/anxious.   All other systems reviewed and are negative.   Blood pressure (!) 147/117, pulse 88, temperature 98.2 F (36.8 C), temperature source Oral, resp. rate 17, height 5\' 1"  (1.549 m), weight 81.6 kg, SpO2 97 %.Body mass index is 34.01 kg/m.  General Appearance: Disheveled  Eye Contact:  Fair  Speech:  Pressured at times  Volume:  Increased  Mood:  Anxious  Affect:  Congruent  Thought Process:  Irrelevant  Orientation:  Other:  person  Thought Content:  Illogical and Hallucinations: Auditory Visual  Suicidal Thoughts:  No  Homicidal Thoughts:  No  Memory:  Immediate;   Poor Recent;   Poor Remote;   Poor  Judgement:  Impaired  Insight:  Lacking  Psychomotor Activity:  Increased  Concentration:  Concentration: Poor and Attention Span: Poor  Recall:  Poor  Fund of Knowledge:  Fair  Language:  Fair  Akathisia:  No  Handed:  Right  AIMS (if indicated):     Assets:  Leisure  Time Resilience  ADL's:  Impaired  Cognition:  Impaired,  Moderate  Sleep:        Treatment Plan Summary: Hallucinations: -Start Haldol 0.5 mg BID oral, avoid IV route  Disposition: Supportive therapy provided about ongoing stressors.  Waylan Boga, NP 11/26/2018 7:14 PM

## 2018-11-27 LAB — CSF CULTURE W GRAM STAIN
Culture: NO GROWTH
Gram Stain: NONE SEEN

## 2018-11-27 LAB — CULTURE, BLOOD (ROUTINE X 2)
Culture: NO GROWTH
Culture: NO GROWTH
Special Requests: ADEQUATE
Special Requests: ADEQUATE

## 2018-11-27 LAB — BLOOD GAS, ARTERIAL
Acid-Base Excess: 1.7 mmol/L (ref 0.0–2.0)
Bicarbonate: 25.5 mmol/L (ref 20.0–28.0)
Drawn by: 398991
FIO2: 21
O2 Saturation: 91.4 %
Patient temperature: 98.5
pCO2 arterial: 38.6 mmHg (ref 32.0–48.0)
pH, Arterial: 7.436 (ref 7.350–7.450)
pO2, Arterial: 63.8 mmHg — ABNORMAL LOW (ref 83.0–108.0)

## 2018-11-27 LAB — AMMONIA: Ammonia: 20 umol/L (ref 9–35)

## 2018-11-27 LAB — GLUCOSE, CAPILLARY
Glucose-Capillary: 105 mg/dL — ABNORMAL HIGH (ref 70–99)
Glucose-Capillary: 165 mg/dL — ABNORMAL HIGH (ref 70–99)
Glucose-Capillary: 187 mg/dL — ABNORMAL HIGH (ref 70–99)
Glucose-Capillary: 164 mg/dL — ABNORMAL HIGH (ref 70–99)

## 2018-11-27 LAB — VITAMIN B1: Vitamin B1 (Thiamine): 129.7 nmol/L (ref 66.5–200.0)

## 2018-11-27 MED ORDER — HALOPERIDOL 0.5 MG PO TABS
0.5000 mg | ORAL_TABLET | Freq: Two times a day (BID) | ORAL | Status: DC
  Administered 2018-11-27: 11:00:00 0.5 mg via ORAL
  Filled 2018-11-27 (×6): qty 1

## 2018-11-27 MED ORDER — ASPIRIN EC 81 MG PO TBEC
81.0000 mg | DELAYED_RELEASE_TABLET | Freq: Every day | ORAL | Status: DC
  Administered 2018-11-28 – 2018-11-29 (×2): 81 mg via ORAL
  Filled 2018-11-27 (×2): qty 1

## 2018-11-27 MED ORDER — THIAMINE HCL 100 MG/ML IJ SOLN
500.0000 mg | Freq: Three times a day (TID) | INTRAVENOUS | Status: AC
  Administered 2018-11-27 – 2018-11-28 (×6): 500 mg via INTRAVENOUS
  Filled 2018-11-27 (×6): qty 5

## 2018-11-27 MED ORDER — THIAMINE HCL 100 MG/ML IJ SOLN
250.0000 mg | INTRAVENOUS | Status: DC
  Administered 2018-11-29: 09:00:00 250 mg via INTRAVENOUS
  Filled 2018-11-27: qty 2.5

## 2018-11-27 NOTE — TOC Progression Note (Signed)
Transition of Care [facility]) - Progression Note    Patient Details  Name: Anna Rojas MRN: [id number] Date of Birth: 1955/04/26  Transition of Care [facility]) CM/SW [place], [place] Phone Number: 11/27/2018, 1:45 PM  Clinical Narrative:   CSW following for discharge plan. Noting per chart review that patient's confusion is worsening, not ready for SNF at this time. CSW received call from [facility] that they can offer to take the patient when she's stable. Patient has also received an offer from [facility]. CSW to update patient's fiance with choices so that he can select a SNF when patient is stable.   Expected Discharge Plan: [place] Barriers to Discharge: Continued Medical Work up, Ship broker, SNF Pending bed offer, Facility will not accept until restraint criteria met  Expected Discharge Plan and Services Expected Discharge Plan: [place] Choice: [place] arrangements for the past 2 months: Single Family Home                                       Social Determinants of Health (SDOH) Interventions    Readmission Risk Interventions No flowsheet data found.

## 2018-11-27 NOTE — Progress Notes (Signed)
  Speech Language Pathology Treatment: Cognitive-Linquistic  Patient Details Name: Anna Rojas MRN: [id number] DOB: 1955/06/18 Today's Date: 11/27/2018 Time: 7893-8101 SLP Time Calculation (min) (ACUTE ONLY): 10 min  Assessment / Plan / Recommendation Clinical Impression  Pt was encountered awake/alert, lying reclined in bed with bilateral mitts in place.  Pt exhibited coherent, but confused speech upon SLP arrival, perseverating on the statements: "Hey my baby girl." "You're my baby girl." Pt fluctuated between periods of clear, but irrelevant speech and unintelligible neologisms/paraphasias.  Pt answered basic yes/no questions with 50% accuracy independently; however, she answered "yes" for almost all questions asked, therefore accuracy may not reflect true receptive language abilities.  Pt exhibited emotional lability throughout the session, alternating between inappropriate laughter, irritability, and excitement.  Pt additionally presented with intermittent periods of body trembling with her eyes rolling backwards.  RN was immediately made aware. Pt was unable to answer any orientation or confrontational naming questions despite max cues.  Recommend continuation of skilled ST for ongoing diagnostic cognitive-linguistic treatment to maximize functional independence and to aid in discharge planning.        HPI HPI: Idamay Hosein is a 63 y.o. female with history of torsades status post ICD placement, history of complete heart block status post pacemaker placement, diabetes mellitus, hypothyroidism was brought to the ER patient was found to be confused by patient's family. Pt with history of alcohol abuse but has been sober for 6 months until she had 7 beers 2 days ago. Pt found to have encephalopathy, Underwent LP to rule out meningitis.      SLP Plan  Continue with current plan of care       Recommendations                  Oral Care Recommendations: Oral care BID Follow up  Recommendations: Skilled Nursing facility SLP Visit Diagnosis: Cognitive communication deficit (B51.025) Plan: Continue with current plan of care       Bretta Bang, M.S., [place] Office: [phone]                [place] 11/27/2018, 3:26 PM

## 2018-11-27 NOTE — Progress Notes (Addendum)
NEUROLOGY PROGRESS NOTE  Subjective: No complaints at this time  Exam: Vitals:   11/27/18 0446 11/27/18 0753  BP: 117/74   Pulse: 99   Resp: 18   Temp: 98.5 F (36.9 C) 98.3 F (36.8 C)  SpO2: 96%   Physical Exam  HEENT-  Normocephalic, no lesions, without obvious abnormality.  Normal external eye and conjunctiva.   Musculoskeletal-no joint tenderness, deformity or swelling Skin-warm and dry, no hyperpigmentation, vitiligo, or suspicious lesions Neuro:  Patient is awake Awake, alert, oriented to self and year.  Is able to name her name but does not know the location. Patient is laughing inappropriately during exam and confabulatiing Patient is able to follow commands such as raising her arms with no focal deficits noted either in motor or sensory No cranial nerve deficits noted on exam Difficult to assess for coordination deficits due to her mental status.    Medications:  Scheduled: . [START ON 11/28/2018] aspirin EC  81 mg Oral Daily  . haloperidol  0.5 mg Oral BID  . insulin aspart  0-9 Units Subcutaneous Q4H  . levothyroxine  250 mcg Oral Q0600  . mometasone-formoterol  2 puff Inhalation BID  . nystatin   Topical BID    Pertinent Labs/Diagnostics: none  Etta Quill PA-C [facility] [phone]  Attending addendum Patient seen and examined independently. Exam confirmed as above. Labs reviewed. Imaging reviewed  Assessment: 63 year old presenting with altered mental status of unclear etiology in setting of her having a first drink after many months of abstaining.  Does have a prior history of alcoholism but had been sober for 9 months before starting to drink again. Concern for CNS infection- CSF studies do not support infectious etiology.  CSF HSV PCR is also negative.Long-term EEG did not show any seizure or seizure-like activity. She continues to have off-and-on paraphasic errors and some confabulation. In the absence of a abnormal EEG or MRI, I  suspect that the current presentation is related to alcoholism.I suspect that her current presentation is related to long-term alcohol abuse and likely Wernicke's encephalopathy.  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  Recommendations: Continue aggressive thiamine replacement I would also check ammonia levels.  If elevated, treat with lactulose. Continue PT OT Supportive treatment with primary team Will need outpatient neuropsychological testing  11/27/2018, 8:42 AM  -- Amie Portland, MD [facility] Pager: [phone] If 7pm to 7am, please call on call as listed on AMION.   ADDENMDUM Ammonia WNL

## 2018-11-27 NOTE — Progress Notes (Signed)
PROGRESS NOTE    Anna Rojas  XFG:[id number] DOB: August 06, 1955 DOA: 11/21/2018 PCP: Patient, No Pcp Per      Brief Narrative:  Anna Rojas is a 63 y.o. F with hypothyroidism, DM, depression, asthma, restrictive lung disease, hx torsades s/p ICD and hx CHB s/p pacer who presented with acute confusion.  Had last been heard from by family a few days prior, seemed normal by phone.  On day of admission, family hadn't been able to reach her, came to house and she was confused.  Family reported at admission that she had had 6-7 beers the day before after a long period of sobriety.  In the ER, initially CODE STROKE.  CT head unremarkable.  EtOH negative.  Utox negative.  Febrile but WBC 8K, Cr and electrolytes normal.       Assessment & Plan:  Acute metabolic encephalopathy, suspect Wernicke's encephalopathy MR brain unremarkable.  LP showed Protein up but 1RBC no WBCs.  CSF VDRL and HSV negative.  The initial concern was for stroke, seizure or encephalitis.  Stroke has been ruled out with MRI, off-and-on seizure has been likely ruled out with prolonged EEG, and encephalitis was ruled out with LP.    Neurology now suspect her symptomatology is more consistent with Wernicke's.  ABG personally reviewed, appears to be hypoxic, normal CO2. -Start aggressive thiamine  -PT eval   Hypoxia while sleeping ABG showed hypoxia while sleeping.  She is snoring, previous CXR clear.  O2 sat normal while awake. Suspect this is sleep apnea. -Start O2  Uncontrolled DM A1c 11% on admission. Glucoses well controlled now. -Hold SU and metformin  Hypothyroidism TSH undetectable at admission, repeat TSH and free T4 normal. -Continue levothyroxine  Depression -Continue Haldol -Hold amitriptyline and Celexa  Asthma -Continue ICS LABA  History CHB with pacer History torsades with ICD Paced rhythm on tele  Other medicaitons -Hold Lasix (unclear indication) -Hold PPI     MDM and disposition:  The below labs and imaging reports were reviewed and summarized above.  Medication management as above.  The patient was admitted with confusion.  She remains severe encephalopathic, altered neurologically.         DVT prophylaxis: SCDs Code Status: FULL Family Communication:     Consultants:   Neurology  Psychiatry  Procedures:   10/2 EEG -- unremarkable  10/2 LP   10/5 overnight EEG -- unremarkable  Antimicrobials:   Vancomycin 10/1 to 10/4  Acyclovir 10/1 to 10/5  Ceftriaxone/Aztreonam 10/1 to 10/3   Culture data:   10/2 urine culture -- multiple species  10/2 blood culture x2 -- NG   10/3 CSF culture -- NGTD  10/4 urine culture -- NG       Subjective: She is sleeping from Haldol.  Nursing report she was inappropriate, agitated and aggressive most of the morning.  Shouting, screaming nonsense, in a fluent way.  Objective: Vitals:   11/27/18 0446 11/27/18 0753 11/27/18 1250 11/27/18 1554  BP: 117/74  (!) 145/89 124/74  Pulse: 99  94 92  Resp: 18  18 18   Temp: 98.5 F (36.9 C) 98.3 F (36.8 C) 98.4 F (36.9 C) 98.5 F (36.9 C)  TempSrc: Oral  Oral Oral  SpO2: 96%  100% 96%  Weight:      Height:        Intake/Output Summary (Last 24 hours) at 11/27/2018 1842 Last data filed at 11/27/2018 1600 Gross per 24 hour  Intake 634.19 ml  Output -  Net 634.19 ml  Filed Weights   11/22/18 0230  Weight: 81.6 kg    Examination: General appearance:  adult female, sleeping, difficult to rouse   HEENT: Anicteric, conjunctiva pink, lids and lashes normal. No nasal deformity, discharge, epistaxis.  Lips moist.   Skin: Warm and dry.  no jaundice.  No suspicious rashes or lesions. Cardiac: RRR, nl S1-S2, no murmurs appreciated.  Capillary refill is brisk.  JVP not visible.  No LE edema.  Radia  pulses 2+ and symmetric. Respiratory: Slow snoring respirations.  No rales or wheezing.   Abdomen: Abdomen soft.  No TTP. No ascites, distension,  hepatosplenomegaly.   MSK: No deformities or effusions. Neuro: Sleeping, rouses minimally to chest rub, then falls back asleep.  PERRL. Speech fluent.    Psych: Unabel to assess.    Data Reviewed: I have personally reviewed following labs and imaging studies:  CBC: Recent Labs  Lab 11/21/18 1804 11/21/18 1811 11/22/18 0256 11/24/18 0718 11/26/18 0955  WBC 8.9  --  7.8 5.9 8.8  NEUTROABS 6.6  --   --   --   --   HGB 14.9 15.0 13.8 12.7 14.3  HCT 44.4 44.0 41.3 37.8 41.7  MCV 87.9  --  88.6 88.7 86.2  PLT 224  --  228 164 209   Basic Metabolic Panel: Recent Labs  Lab 11/21/18 1804 11/21/18 1811 11/22/18 0256 11/23/18 1033 11/24/18 0718 11/26/18 0955  NA 140 139 142 143 138 137  K 4.5 4.2 3.4* 3.0* 3.4* 3.7  CL 105 105 105 107 104 101  CO2 24  --  23 26 25 24   GLUCOSE 162* 153* 122* 117* 173* 222*  BUN 9 11 10  6* 6* 7*  CREATININE 0.64 0.50 0.68 0.50 0.57 0.54  CALCIUM 8.9  --  8.9 8.6* 8.5* 9.1   GFR: Estimated Creatinine Clearance: 70.6 mL/min (by C-G formula based on SCr of 0.54 mg/dL). Liver Function Tests: Recent Labs  Lab 11/21/18 1804 11/22/18 0256 11/23/18 1033  AST 77* 56* 38  ALT 36 31 26  ALKPHOS 142* 126 96  BILITOT 1.1 1.0 0.7  PROT 6.4* 6.2* 6.0*  ALBUMIN 3.0* 2.9* 2.7*   No results for input(s): LIPASE, AMYLASE in the last 168 hours. Recent Labs  Lab 11/21/18 1920 11/27/18 0949  AMMONIA 10 20   Coagulation Profile: Recent Labs  Lab 11/21/18 1804  INR 1.2   Cardiac Enzymes: No results for input(s): CKTOTAL, CKMB, CKMBINDEX, TROPONINI in the last 168 hours. BNP (last 3 results) No results for input(s): PROBNP in the last 8760 hours. HbA1C: No results for input(s): HGBA1C in the last 72 hours. CBG: Recent Labs  Lab 11/26/18 2119 11/26/18 2328 11/27/18 0443 11/27/18 1244 11/27/18 1557  GLUCAP 126* 138* 105* 165* 187*   Lipid Profile: No results for input(s): CHOL, HDL, LDLCALC, TRIG, CHOLHDL, LDLDIRECT in the last 72  hours. Thyroid Function Tests: Recent Labs    11/26/18 0955  TSH 0.570  FREET4 0.90   Anemia Panel: No results for input(s): VITAMINB12, FOLATE, FERRITIN, TIBC, IRON, RETICCTPCT in the last 72 hours. Urine analysis:    Component Value Date/Time   COLORURINE YELLOW 11/22/2018 0107   APPEARANCEUR CLEAR 11/22/2018 0107   LABSPEC >1.046 (H) 11/22/2018 0107   PHURINE 5.0 11/22/2018 0107   GLUCOSEU NEGATIVE 11/22/2018 0107   HGBUR SMALL (A) 11/22/2018 0107   BILIRUBINUR NEGATIVE 11/22/2018 0107   KETONESUR 20 (A) 11/22/2018 0107   PROTEINUR 30 (A) 11/22/2018 0107   NITRITE NEGATIVE 11/22/2018 0107  LEUKOCYTESUR NEGATIVE 11/22/2018 0107   Sepsis Labs: (procalcitonin:4,lacticacidven:4)  ) Recent Results (from the past 240 hour(s))  SARS Coronavirus 2 [facility] order, Performed in [facility] hospital lab) Nasopharyngeal Nasopharyngeal Swab     Status: None   Collection Time: 11/21/18  6:40 PM   Specimen: Nasopharyngeal Swab  Result Value Ref Range Status   SARS Coronavirus 2 NEGATIVE NEGATIVE Final    Comment: (NOTE) If result is NEGATIVE SARS-CoV-2 target nucleic acids are NOT DETECTED. The SARS-CoV-2 RNA is generally detectable in upper and lower  respiratory specimens during the acute phase of infection. The lowest  concentration of SARS-CoV-2 viral copies this assay can detect is 250  copies / mL. A negative result does not preclude SARS-CoV-2 infection  and should not be used as the sole basis for treatment or other  patient management decisions.  A negative result may occur with  improper specimen collection / handling, submission of specimen other  than nasopharyngeal swab, presence of viral mutation(s) within the  areas targeted by this assay, and inadequate number of viral copies  (<250 copies / mL). A negative result must be combined with clinical  observations, patient history, and epidemiological information. If result is POSITIVE SARS-CoV-2 target  nucleic acids are DETECTED. The SARS-CoV-2 RNA is generally detectable in upper and lower  respiratory specimens dur ing the acute phase of infection.  Positive  results are indicative of active infection with SARS-CoV-2.  Clinical  correlation with patient history and other diagnostic information is  necessary to determine patient infection status.  Positive results do  not rule out bacterial infection or co-infection with other viruses. If result is PRESUMPTIVE POSTIVE SARS-CoV-2 nucleic acids MAY BE PRESENT.   A presumptive positive result was obtained on the submitted specimen  and confirmed on repeat testing.  While 2019 novel coronavirus  (SARS-CoV-2) nucleic acids may be present in the submitted sample  additional confirmatory testing may be necessary for epidemiological  and / or clinical management purposes  to differentiate between  SARS-CoV-2 and other Sarbecovirus currently known to infect humans.  If clinically indicated additional testing with an alternate test  methodology [phone]) is advised. The SARS-CoV-2 RNA is generally  detectable in upper and lower respiratory sp ecimens during the acute  phase of infection. The expected result is Negative. Fact Sheet for Patients:  BoilerBrush.com.cy Fact Sheet for Healthcare Providers: https://pope.com/ This test is not yet approved or cleared by the [place] FDA and has been authorized for detection and/or diagnosis of SARS-CoV-2 by FDA under an Emergency Use Authorization (EUA).  This EUA will remain in effect (meaning this test can be used) for the duration of the COVID-19 declaration under Section 564(b)(1) of the Act, 21 U.S.C. section 360bbb-3(b)(1), unless the authorization is terminated or revoked sooner. Performed at [facility], [address], [place], [place] [address]   Culture, Urine     Status: Abnormal   Collection Time: 11/22/18  1:16 AM    Specimen: Urine, Random  Result Value Ref Range Status   Specimen Description URINE, RANDOM  Final   Special Requests   Final    NONE Performed at [facility], [address], [place], [place] [address]    Culture MULTIPLE SPECIES PRESENT, SUGGEST RECOLLECTION (A)  Final   Report Status 11/22/2018 FINAL  Final  Culture, blood (routine x 2)     Status: None   Collection Time: 11/22/18  2:00 AM   Specimen: BLOOD  Result Value Ref  Range Status   Specimen Description BLOOD SITE NOT SPECIFIED  Final   Special Requests   Final    BOTTLES DRAWN AEROBIC AND ANAEROBIC Blood Culture adequate volume   Culture   Final    NO GROWTH 5 DAYS Performed at [facility], [address] [place], [place] [address]    Report Status 11/27/2018 FINAL  Final  Culture, blood (routine x 2)     Status: None   Collection Time: 11/22/18  2:15 AM   Specimen: BLOOD  Result Value Ref Range Status   Specimen Description BLOOD SITE NOT SPECIFIED  Final   Special Requests   Final    BOTTLES DRAWN AEROBIC AND ANAEROBIC Blood Culture adequate volume   Culture   Final    NO GROWTH 5 DAYS Performed at [facility], [address], [place], [place] [address]    Report Status 11/27/2018 FINAL  Final  CSF culture     Status: None   Collection Time: 11/23/18  3:57 PM   Specimen: PATH Cytology CSF; Cerebrospinal Fluid  Result Value Ref Range Status   Specimen Description CSF  Final   Special Requests NONE  Final   Gram Stain NO WBC SEEN NO ORGANISMS SEEN CYTOSPIN SMEAR   Final   Culture   Final    NO GROWTH 3 DAYS Performed at [facility], [address], [place], [place] [address]    Report Status 11/27/2018 FINAL  Final  Culture, Urine     Status: Abnormal   Collection Time: 11/24/18 12:12 PM   Specimen: Urine, Catheterized  Result Value Ref Range Status   Specimen Description URINE, CATHETERIZED  Final   Special Requests NONE  Final   Culture (A)  Final    <10,000 COLONIES/mL  INSIGNIFICANT GROWTH Performed at [facility], [address], [place], [place] [address]    Report Status 11/25/2018 FINAL  Final         Radiology Studies: No results found.      Scheduled Meds: . [START ON 11/28/2018] aspirin EC  81 mg Oral Daily  . haloperidol  0.5 mg Oral BID  . insulin aspart  0-9 Units Subcutaneous Q4H  . levothyroxine  250 mcg Oral Q0600  . mometasone-formoterol  2 puff Inhalation BID  . nystatin   Topical BID   Continuous Infusions: . sodium chloride Stopped (11/24/18 0227)  . thiamine injection 500 mg (11/27/18 1800)   Followed by  . [START ON 11/29/2018] thiamine injection       LOS: 5 days    Time spent: 35 minutes    Alberteen Sam, MD [facility] 11/27/2018, 6:42 PM     Please page through AMION:  [email or web address] Password [email or web address] If 7PM-7AM, please contact night-coverage

## 2018-11-28 LAB — GLUCOSE, CAPILLARY
Glucose-Capillary: 129 mg/dL — ABNORMAL HIGH (ref 70–99)
Glucose-Capillary: 165 mg/dL — ABNORMAL HIGH (ref 70–99)
Glucose-Capillary: 179 mg/dL — ABNORMAL HIGH (ref 70–99)
Glucose-Capillary: 189 mg/dL — ABNORMAL HIGH (ref 70–99)
Glucose-Capillary: 287 mg/dL — ABNORMAL HIGH (ref 70–99)

## 2018-11-28 NOTE — Progress Notes (Signed)
Physical Therapy Treatment Patient Details Name: Anna Rojas MRN: [id number] DOB: 29-Mar-1955 Today's Date: 11/28/2018    History of Present Illness Anna Rojas is a 63 y.o. female with history of torsades status post ICD placement, history of complete heart block status post pacemaker placement, diabetes mellitus, hypothyroidism was brought to the ER patient was found to be confused by patient's family. Pt with history of alcohol abuse but has been sober for 6 months until she had 7 beers 2 days ago. Pt found to have encephalopathy, Underwent LP to rule out meningitis.    PT Comments    Pt performed gt training and functional mobility with great functional gains.  She started out with transfers in sara stedy with good response.  Based on her ability to stand and do ADLs at the sink we opted to transfer to a RW and attempt gt training.  Pt responded well.  She continues to present with cognitive impairments and remains suited for short term rehab at [facility].     Follow Up Recommendations  SNF;Supervision/Assistance - 24 hour     Equipment Recommendations  Rolling walker with 5" wheels;3in1 (PT)    Recommendations for Other Services       Precautions / Restrictions Precautions Precautions: Fall Restrictions Weight Bearing Restrictions: No    Mobility  Bed Mobility Overal bed mobility: Needs Assistance Bed Mobility: Supine to Sit     Supine to sit: Supervision     General bed mobility comments: Increased time and effort to move to edge of bed but no assistance needed,  Transfers Overall transfer level: Needs assistance Equipment used: Ambulation equipment used;Rolling walker (2 wheeled)(sara stedy used initially) Transfers: Sit to/from Stand Sit to Stand: Min assist;Min guard;+2 safety/equipment(min guard with stedy, min assistance with RW)         General transfer comment: Cues for hand placement.  Pt impulsively stood from bed before stedy was locked.  She performed  marching in standing but reported she was too weak to walk.  Transferred patient to recliner chair and had her repeat sit to stand with RW.  Min assistance required.  Ambulation/Gait Ambulation/Gait assistance: Min assist;+2 safety/equipment Gait Distance (Feet): 30 Feet Assistive device: Rolling walker (2 wheeled) Gait Pattern/deviations: Step-through pattern;Decreased stride length;Trunk flexed     General Gait Details: Cues for RW position and to stay close to device.  She reports mild discomfort in her feet.   Stairs             Wheelchair Mobility    Modified Rankin (Stroke Patients Only)       Balance Overall balance assessment: Needs assistance   Sitting balance-Leahy Scale: Fair       Standing balance-Leahy Scale: Poor Standing balance comment: B UE support and external assistance.  Intermittent moments with out UE support and does well.                            Cognition Arousal/Alertness: Awake/alert Behavior During Therapy: Impulsive;WFL for tasks assessed/performed Overall Cognitive Status: Impaired/Different from baseline Area of Impairment: Memory;Following commands;Safety/judgement                     Memory: Decreased short-term memory(unable to find her room.) Following Commands: Follows one step commands with increased time Safety/Judgement: Decreased awareness of safety;Decreased awareness of deficits   Problem Solving: Slow processing;Requires verbal cues;Requires tactile cues        Exercises  General Comments        Pertinent Vitals/Pain Pain Assessment: Faces Faces Pain Scale: Hurts a little bit Pain Location: B feet Pain Descriptors / Indicators: Discomfort Pain Intervention(s): Monitored during session;Repositioned    Home Living                      Prior Function            PT Goals (current goals can now be found in the care plan section) Acute Rehab PT Goals Patient Stated Goal:  none stated PT Goal Formulation: Patient unable to participate in goal setting Potential to Achieve Goals: Fair Progress towards PT goals: Progressing toward goals    Frequency    Min 2X/week      PT Plan Current plan remains appropriate;Frequency needs to be updated    Co-evaluation PT/OT/SLP Co-Evaluation/Treatment: Yes Reason for Co-Treatment: Complexity of the patient's impairments (multi-system involvement) PT goals addressed during session: Mobility/safety with mobility OT goals addressed during session: ADL's and self-care      AM-PAC PT "6 Clicks" Mobility   Outcome Measure  Help needed turning from your back to your side while in a flat bed without using bedrails?: A Lot Help needed moving from lying on your back to sitting on the side of a flat bed without using bedrails?: A Lot Help needed moving to and from a bed to a chair (including a wheelchair)?: Total Help needed standing up from a chair using your arms (e.g., wheelchair or bedside chair)?: Total Help needed to walk in hospital room?: Total Help needed climbing 3-5 steps with a railing? : Total 6 Click Score: 8    End of Session Equipment Utilized During Treatment: Gait belt Activity Tolerance: Patient tolerated treatment well Patient left: with call bell/phone within reach;with nursing/sitter in room;in chair;with chair alarm set(telesitter) Nurse Communication: Mobility status PT Visit Diagnosis: Unsteadiness on feet (R26.81);Muscle weakness (generalized) (M62.81);Difficulty in walking, not elsewhere classified (R26.2)     Time: 9470-9628 PT Time Calculation (min) (ACUTE ONLY): 28 min  Charges:  $Gait Training: 8-22 mins                     Governor Rooks, PTA Acute Rehabilitation Services Pager [phone] Office [phone]     Glendel Jaggers Eli Hose 11/28/2018, 12:32 PM

## 2018-11-28 NOTE — Progress Notes (Signed)
Occupational Therapy Treatment Patient Details Name: Anna Rojas MRN: [id number] DOB: April 09, 1955 Today's Date: 11/28/2018    History of present illness Anna Rojas is a 63 y.o. female with history of torsades status post ICD placement, history of complete heart block status post pacemaker placement, diabetes mellitus, hypothyroidism was brought to the ER patient was found to be confused by patient's family. Pt with history of alcohol abuse but has been sober for 6 months until she had 7 beers 2 days ago. Pt found to have encephalopathy, Underwent LP to rule out meningitis.   OT comments  Pt progressing well today with +1 assist for therapy. Pt more alert and able to follow commands well. Pt performing light grooming at sink in standing with and without stedy. Pt performing own toilet hygiene with minA overall for stability in standing. Pt using BUEs for tasks requiring cues to attend to task as pt easily side tracked. Pt limited by decreased ability to attend to tasks, weakness and poor activity tolerance. Pt tolerating session well. OT following acutely. Pt requires continued OT for ADL, mobility and safety.    Follow Up Recommendations  SNF;Supervision/Assistance - 24 hour    Equipment Recommendations  3 in 1 bedside commode    Recommendations for Other Services      Precautions / Restrictions Precautions Precautions: Fall Restrictions Weight Bearing Restrictions: No       Mobility Bed Mobility Overal bed mobility: Needs Assistance Bed Mobility: Supine to Sit     Supine to sit: Supervision     General bed mobility comments: Increased time and effort to move to edge of bed but no assistance needed,  Transfers Overall transfer level: Needs assistance Equipment used: Ambulation equipment used;Rolling walker (2 wheeled)(used stedy initially) Transfers: Sit to/from Stand Sit to Stand: Min assist;Min guard;+2 safety/equipment         General transfer comment: Cues for  hand placement.  Pt impulsively stood from bed before stedy was locked.      Balance Overall balance assessment: Needs assistance   Sitting balance-Leahy Scale: Good       Standing balance-Leahy Scale: Poor Standing balance comment: B UE support and external assistance.  Intermittent moments with out UE support and does well.                           ADL either performed or assessed with clinical judgement   ADL Overall ADL's : Needs assistance/impaired     Grooming: Min guard;Standing Grooming Details (indicate cue type and reason): Pt using BUEs for tasks requiring cues to attend to task as pt easily side tracked                 Toilet Transfer: Minimal assistance;BSC;RW Toilet Transfer Details (indicate cue type and reason): for safety Toileting- Clothing Manipulation and Hygiene: Min guard;Minimal assistance;Cueing for safety;Sitting/lateral lean;Sit to/from stand Toileting - Clothing Manipulation Details (indicate cue type and reason): In standing holding onto RW     Functional mobility during ADLs: Minimal assistance;Rolling walker;Cueing for safety General ADL Comments: Pt limited by decreased ability to attend to tasks, weakness and poor activity tolerance     Vision       Perception     Praxis      Cognition Arousal/Alertness: Awake/alert Behavior During Therapy: Impulsive;WFL for tasks assessed/performed Overall Cognitive Status: Impaired/Different from baseline Area of Impairment: Memory;Following commands;Safety/judgement  Memory: Decreased short-term memory Following Commands: Follows one step commands with increased time Safety/Judgement: Decreased awareness of safety;Decreased awareness of deficits   Problem Solving: Slow processing;Requires verbal cues          Exercises     Shoulder Instructions       General Comments Pt requires cues for safety. VSS.    Pertinent Vitals/ Pain       Pain  Assessment: No/denies pain Faces Pain Scale: Hurts a little bit Pain Location: B feet Pain Descriptors / Indicators: Discomfort Pain Intervention(s): Monitored during session;Repositioned  Home Living                                          Prior Functioning/Environment              Frequency  Min 2X/week        Progress Toward Goals  OT Goals(current goals can now be found in the care plan section)  Progress towards OT goals: Progressing toward goals  Acute Rehab OT Goals Patient Stated Goal: none stated OT Goal Formulation: With patient Time For Goal Achievement: 12/08/18 Potential to Achieve Goals: Fair ADL Goals Pt Will Perform Eating: with supervision;with set-up;sitting Pt Will Perform Grooming: (P) with modified independence;standing Pt Will Perform Upper Body Bathing: sitting;with min assist Pt Will Transfer to Toilet: with min assist;stand pivot transfer;bedside commode Pt Will Perform Toileting - Clothing Manipulation and hygiene: with min assist;sit to/from stand Additional ADL Goal #1: Pt will follow 1 step commands with 90% accuracy and attend to ADL task for 3 minutes without redirection in quiet non distracting enviorment.  Plan Discharge plan remains appropriate    Co-evaluation      Reason for Co-Treatment: Complexity of the patient's impairments (multi-system involvement) PT goals addressed during session: Mobility/safety with mobility OT goals addressed during session: ADL's and self-care      AM-PAC OT "6 Clicks" Daily Activity     Outcome Measure   Help from another person eating meals?: None Help from another person taking care of personal grooming?: A Little Help from another person toileting, which includes using toliet, bedpan, or urinal?: A Little Help from another person bathing (including washing, rinsing, drying)?: A Little Help from another person to put on and taking off regular upper body clothing?:  None Help from another person to put on and taking off regular lower body clothing?: A Little 6 Click Score: 20    End of Session Equipment Utilized During Treatment: Gait belt;Rolling walker  OT Visit Diagnosis: Other abnormalities of gait and mobility (R26.89);Muscle weakness (generalized) (M62.81);Cognitive communication deficit (R41.841);Other symptoms and signs involving cognitive function   Activity Tolerance Patient tolerated treatment well   Patient Left with call bell/phone within reach;in bed;with bed alarm set   Nurse Communication Mobility status        Time: 4010-2725 OT Time Calculation (min): 37 min  Charges: OT General Charges $OT Visit: 1 Visit OT Treatments $Self Care/Home Management : 8-22 mins  Darryl Nestle) Anna Rojas OTR/L Acute Rehabilitation Services Pager: [phone] Office: [phone]    Anna Rojas 11/28/2018, 3:28 PM

## 2018-11-28 NOTE — Progress Notes (Signed)
Neurology Progress Note   S:// Seen and examined.  No changes overnight Reports she still feels confused   O:// Current vital signs: BP 108/75 (BP Location: Left Arm)   Pulse 75   Temp 97.9 F (36.6 C) (Oral)   Resp 18   Ht 5\' 1"  (1.549 m)   Wt 81.6 kg   SpO2 100%   BMI 34.01 kg/m  Vital signs in last 24 hours: Temp:  [97.4 F (36.3 C)-98.6 F (37 C)] 97.9 F (36.6 C) (10/08 0804) Pulse Rate:  [73-94] 75 (10/08 0804) Resp:  [17-18] 18 (10/08 0804) BP: (103-145)/(47-89) 108/75 (10/08 0804) SpO2:  [96 %-100 %] 100 % (10/08 0804) General: Sleeping, easily arousable to voice. HEENT: Cephalic atraumatic Lungs: Clear to auscultation Vascular: Regular rate rhythm Abdomen: Soft nondistended nontender Extremities warm well perfused Neurological exam Mental status: Awake alert oriented to self.  Not oriented to place or time. Language and speech: Speech is mildly dysarthric.  She is able to repeat some sentences fluently but at other times makes paraphasic errors.  The normal conversation in short sentences is fine but longer sentences she does make paraphasic errors. Cranial nerves: Pupils equal round reactive to light, extraocular movements intact, facial sensation intact, face symmetric, tongue and uvula midline. Motor exam: Antigravity in all fours Sensory exam intact light touch Coordination: No dysmetria  Medications  Current Facility-Administered Medications:  .  0.9 %  sodium chloride infusion, , Intravenous, PRN, Mariel Aloe, MD, Stopped at 11/24/18 0227 .  acetaminophen (TYLENOL) tablet 650 mg, 650 mg, Oral, Q4H PRN **OR** acetaminophen (TYLENOL) solution 650 mg, 650 mg, Per Tube, Q4H PRN **OR** acetaminophen (TYLENOL) suppository 650 mg, 650 mg, Rectal, Q4H PRN, Rise Patience, MD, 650 mg at 11/22/18 0106 .  aspirin EC tablet 81 mg, 81 mg, Oral, Daily, Danford, Suann Larry, MD, 81 mg at 11/28/18 0857 .  haloperidol (HALDOL) tablet 0.5 mg, 0.5 mg, Oral,  BID, Patrecia Pour, NP, 0.5 mg at 11/27/18 1125 .  insulin aspart (novoLOG) injection 0-9 Units, 0-9 Units, Subcutaneous, Q4H, Rise Patience, MD, 1 Units at 11/28/18 [phone] .  levothyroxine (SYNTHROID) tablet 250 mcg, 250 mcg, Oral, Q0600, Mariel Aloe, MD, 250 mcg at 11/28/18 0539 .  mometasone-formoterol (DULERA) 200-5 MCG/ACT inhaler 2 puff, 2 puff, Inhalation, BID, Rise Patience, MD, 2 puff at 11/28/18 0859 .  nystatin (MYCOSTATIN/NYSTOP) topical powder, , Topical, BID, Mariel Aloe, MD .  thiamine 500mg  in normal saline (80ml) IVPB, 500 mg, Intravenous, TID, Last Rate: 100 mL/hr at 11/28/18 0908, 500 mg at 11/28/18 0908 **FOLLOWED BY** [START ON 11/29/2018] thiamine (B-1) 250 mg in sodium chloride 0.9 % 50 mL IVPB, 250 mg, Intravenous, Q24H, Edwin Dada, MD Labs CBC    Component Value Date/Time   WBC 8.8 11/26/2018 0955   RBC 4.84 11/26/2018 0955   HGB 14.3 11/26/2018 0955   HCT 41.7 11/26/2018 0955   PLT 209 11/26/2018 0955   MCV 86.2 11/26/2018 0955   MCH 29.5 11/26/2018 0955   MCHC 34.3 11/26/2018 0955   RDW 12.8 11/26/2018 0955   LYMPHSABS 1.4 11/21/2018 1804   MONOABS 0.7 11/21/2018 1804   EOSABS 0.1 11/21/2018 1804   BASOSABS 0.0 11/21/2018 1804    CMP     Component Value Date/Time   NA 137 11/26/2018 0955   K 3.7 11/26/2018 0955   CL 101 11/26/2018 0955   CO2 24 11/26/2018 0955   GLUCOSE 222 (H) 11/26/2018 0955   BUN 7 (  L) 11/26/2018 0955   CREATININE 0.54 11/26/2018 0955   CALCIUM 9.1 11/26/2018 0955   PROT 6.0 (L) 11/23/2018 1033   ALBUMIN 2.7 (L) 11/23/2018 1033   AST 38 11/23/2018 1033   ALT 26 11/23/2018 1033   ALKPHOS 96 11/23/2018 1033   BILITOT 0.7 11/23/2018 1033   GFRNONAA >60 11/26/2018 0955   GFRAA >60 11/26/2018 0955   maging No new imaging to review  Assessment: 63 year old presenting with altered mental status of unclear etiology.  She had her first drinks 6-7 beers after months of abstaining.  Has a prior  history of chronic alcoholism. Initial concern for stroke versus CNS infection-both ruled out with MRI and CSF analysis including HSV PCR which was negative. The next consideration on the Differential was seizures.  Long-term EEG did not show any seizure or seizure-like activity. Continues to make off-and-on paraphasic errors and is not at her baseline but I suspect that this is likely Wernicke's encephalopathy from long-term alcohol use.  Impression: Likely Wernicke's encephalopathy  Recommendations: Continue aggressive thiamine replacement Continue PT OT  -- Milon Dikes, MD [facility] Pager: [phone] If 7pm to 7am, please call on call as listed on AMION.

## 2018-11-28 NOTE — Progress Notes (Signed)
PROGRESS NOTE    Anna Rojas  VQM:[id number] DOB: 04-16-1955 DOA: 11/21/2018 PCP: Patient, No Pcp Per      Brief Narrative:  Mrs. Winchel is a 63 y.o. F with hypothyroidism, DM, depression, asthma, restrictive lung disease, hx torsades s/p ICD and hx CHB s/p pacer who presented with acute confusion.  Had last been heard from by family a few days prior, seemed normal by phone.  On day of admission, family hadn't been able to reach her, came to house and she was confused.  Family reported at admission that she had had 6-7 beers the day before after a long period of sobriety.  In the ER, initially CODE STROKE.  CT head unremarkable.  EtOH negative.  Utox negative.  Febrile but WBC 8K, Cr and electrolytes normal.       Assessment & Plan:  Acute metabolic encephalopathy, suspect Wernicke's encephalopathy MR brain unremarkable.  LP showed Protein up but 1RBC no WBCs.  CSF VDRL and HSV negative.  The initial concern was for stroke, seizure or encephalitis.  Stroke has been ruled out with MRI, off-and-on seizure has been likely ruled out with prolonged EEG, and encephalitis was ruled out with LP.    Neurology now suspect her symptomatology is more consistent with Wernicke's.    Symptoms CONSIDERABLY improved today. -Continue high dose IV thiamine  -PT eval   Hypoxia while sleeping ABG showed hypoxia while sleeping.  She is snoring, previous CXR clear.  O2 sat normal while awake.   -Outpatient sleep study recommended  Uncontrolled DM A1c 11% on admission. Glucoses well controlled -Hold SU and metformin -Continue SSI corrections  Hypothyroidism TSH undetectable at admission, repeat TSH and free T4 normal. -Continue levothyroxine  Depression -Continue Haldol -Hold amitriptyline and Celexa  Asthma No active disease -Continue ICS LABA  History CHB with pacer History torsades with ICD Paced rhythm on tele  Other medicaitons -Hold Lasix (unclear indication) -Hold PPI      MDM and disposition: The below labs and imaging reports reviewed and summarized above.  Medication management as above.  The patient was admitted with confusion, her encephalopathy has improved considerably, although she is still ataxic, and requires ongoing IV thiamine for Severe wernicke encephalopathy.  PT evaluation, possibly home in 24 to 48 hours if continue to have improvement.          DVT prophylaxis: SCDs Code Status: FULL Family Communication: Boyfriend by phone    Consultants:   Neurology  Psychiatry  Procedures:   10/2 EEG -- unremarkable  10/2 LP   10/5 overnight EEG -- unremarkable  Antimicrobials:   Vancomycin 10/1 to 10/4  Acyclovir 10/1 to 10/5  Ceftriaxone/Aztreonam 10/1 to 10/3   Culture data:   10/2 urine culture -- multiple species  10/2 blood culture x2 -- NG   10/3 CSF culture -- NGTD  10/4 urine culture -- NG       Subjective: Considerably better overnight.  She is oriented and interactive today.  She is sometimes inappropriate, and giggly, and she is still ataxic with physical therapy, requiring assistance for ambulation even a short distance, and frequent cues (which is different from her baseline, she is completely independent at baseline).  However she has had no fever, confusion, respiratory distress, cough, vomiting.      Objective: Vitals:   11/27/18 2328 11/28/18 0411 11/28/18 0804 11/28/18 1141  BP: (!) 103/47 (!) 121/56 108/75 126/69  Pulse: 84 73 75 75  Resp: 17 17 18 18   Temp: (!) 97.4  F (36.3 C) 97.6 F (36.4 C) 97.9 F (36.6 C) 97.8 F (36.6 C)  TempSrc: Oral Oral Oral Oral  SpO2: 100% 100% 100%   Weight:      Height:        Intake/Output Summary (Last 24 hours) at 11/28/2018 1420 Last data filed at 11/28/2018 0700 Gross per 24 hour  Intake 994.19 ml  Output -  Net 994.19 ml   Filed Weights   11/22/18 0230  Weight: 81.6 kg    Examination: General appearance: Adult female, sitting in  recliner, no acute distress, interactive.  Eping, difficult to rouse   HEENT: Anicteric, conjunctival pink, lids lashes normal.  No nasal deformity, discharge, or epistaxis.  Lips moist, dentition normal, oropharynx, tacky dry, no oral lesions, hearing normal. Skin: Warm and dry.  no jaundice.  No suspicious rashes or lesions. Cardiac: RRR, soft systolic murmur, no JVP not visible due to body habitus, no lower extremity edema Respiratory: Normal respiratory effort, good air entry, no rales or wheezes Abdomen: Abdomen soft.  No TTP. No ascites, distension, hepatosplenomegaly.   MSK: No deformities or effusions. Neuro: Finger-nose testing bilaterally ataxic.  No nystagmus, but she has saccades and does not have smooth pursuit of EOM.  Strength symmetric but ataxic coordination in bilateral upper and lower extremities.  Speech fluent. Psych: Oriented to person, place, and situation, thought content appropriate, attention normal.    Data Reviewed: I have personally reviewed following labs and imaging studies:  CBC: Recent Labs  Lab 11/21/18 1804 11/21/18 1811 11/22/18 0256 11/24/18 0718 11/26/18 0955  WBC 8.9  --  7.8 5.9 8.8  NEUTROABS 6.6  --   --   --   --   HGB 14.9 15.0 13.8 12.7 14.3  HCT 44.4 44.0 41.3 37.8 41.7  MCV 87.9  --  88.6 88.7 86.2  PLT 224  --  228 164 209   Basic Metabolic Panel: Recent Labs  Lab 11/21/18 1804 11/21/18 1811 11/22/18 0256 11/23/18 1033 11/24/18 0718 11/26/18 0955  NA 140 139 142 143 138 137  K 4.5 4.2 3.4* 3.0* 3.4* 3.7  CL 105 105 105 107 104 101  CO2 24  --  23 26 25 24   GLUCOSE 162* 153* 122* 117* 173* 222*  BUN 9 11 10  6* 6* 7*  CREATININE 0.64 0.50 0.68 0.50 0.57 0.54  CALCIUM 8.9  --  8.9 8.6* 8.5* 9.1   GFR: Estimated Creatinine Clearance: 70.6 mL/min (by C-G formula based on SCr of 0.54 mg/dL). Liver Function Tests: Recent Labs  Lab 11/21/18 1804 11/22/18 0256 11/23/18 1033  AST 77* 56* 38  ALT 36 31 26  ALKPHOS 142* 126  96  BILITOT 1.1 1.0 0.7  PROT 6.4* 6.2* 6.0*  ALBUMIN 3.0* 2.9* 2.7*   No results for input(s): LIPASE, AMYLASE in the last 168 hours. Recent Labs  Lab 11/21/18 1920 11/27/18 0949  AMMONIA 10 20   Coagulation Profile: Recent Labs  Lab 11/21/18 1804  INR 1.2   Cardiac Enzymes: No results for input(s): CKTOTAL, CKMB, CKMBINDEX, TROPONINI in the last 168 hours. BNP (last 3 results) No results for input(s): PROBNP in the last 8760 hours. HbA1C: No results for input(s): HGBA1C in the last 72 hours. CBG: Recent Labs  Lab 11/27/18 2028 11/28/18 0022 11/28/18 0435 11/28/18 0802 11/28/18 1137  GLUCAP 164* 189* 165* 129* 287*   Lipid Profile: No results for input(s): CHOL, HDL, LDLCALC, TRIG, CHOLHDL, LDLDIRECT in the last 72 hours. Thyroid Function Tests: Recent Labs  11/26/18 0955  TSH 0.570  FREET4 0.90   Anemia Panel: No results for input(s): VITAMINB12, FOLATE, FERRITIN, TIBC, IRON, RETICCTPCT in the last 72 hours. Urine analysis:    Component Value Date/Time   COLORURINE YELLOW 11/22/2018 0107   APPEARANCEUR CLEAR 11/22/2018 0107   LABSPEC >1.046 (H) 11/22/2018 0107   PHURINE 5.0 11/22/2018 0107   GLUCOSEU NEGATIVE 11/22/2018 0107   HGBUR SMALL (A) 11/22/2018 0107   BILIRUBINUR NEGATIVE 11/22/2018 0107   KETONESUR 20 (A) 11/22/2018 0107   PROTEINUR 30 (A) 11/22/2018 0107   NITRITE NEGATIVE 11/22/2018 0107   LEUKOCYTESUR NEGATIVE 11/22/2018 0107   Sepsis Labs: @LABRCNTIP (procalcitonin:4,lacticacidven:4)  ) Recent Results (from the past 240 hour(s))  SARS Coronavirus 2 [facility] order, Performed in [facility] hospital lab) Nasopharyngeal Nasopharyngeal Swab     Status: None   Collection Time: 11/21/18  6:40 PM   Specimen: Nasopharyngeal Swab  Result Value Ref Range Status   SARS Coronavirus 2 NEGATIVE NEGATIVE Final    Comment: (NOTE) If result is NEGATIVE SARS-CoV-2 target nucleic acids are NOT DETECTED. The SARS-CoV-2 RNA is generally detectable  in upper and lower  respiratory specimens during the acute phase of infection. The lowest  concentration of SARS-CoV-2 viral copies this assay can detect is 250  copies / mL. A negative result does not preclude SARS-CoV-2 infection  and should not be used as the sole basis for treatment or other  patient management decisions.  A negative result may occur with  improper specimen collection / handling, submission of specimen other  than nasopharyngeal swab, presence of viral mutation(s) within the  areas targeted by this assay, and inadequate number of viral copies  (<250 copies / mL). A negative result must be combined with clinical  observations, patient history, and epidemiological information. If result is POSITIVE SARS-CoV-2 target nucleic acids are DETECTED. The SARS-CoV-2 RNA is generally detectable in upper and lower  respiratory specimens dur ing the acute phase of infection.  Positive  results are indicative of active infection with SARS-CoV-2.  Clinical  correlation with patient history and other diagnostic information is  necessary to determine patient infection status.  Positive results do  not rule out bacterial infection or co-infection with other viruses. If result is PRESUMPTIVE POSTIVE SARS-CoV-2 nucleic acids MAY BE PRESENT.   A presumptive positive result was obtained on the submitted specimen  and confirmed on repeat testing.  While 2019 novel coronavirus  (SARS-CoV-2) nucleic acids may be present in the submitted sample  additional confirmatory testing may be necessary for epidemiological  and / or clinical management purposes  to differentiate between  SARS-CoV-2 and other Sarbecovirus currently known to infect humans.  If clinically indicated additional testing with an alternate test  methodology [phone]) is advised. The SARS-CoV-2 RNA is generally  detectable in upper and lower respiratory sp ecimens during the acute  phase of infection. The expected result is  Negative. Fact Sheet for Patients:  (WNU2725 Fact Sheet for Healthcare Providers: BoilerBrush.com.cy This test is not yet approved or cleared by the https://pope.com/ FDA and has been authorized for detection and/or diagnosis of SARS-CoV-2 by FDA under an Emergency Use Authorization (EUA).  This EUA will remain in effect (meaning this test can be used) for the duration of the COVID-19 declaration under Section 564(b)(1) of the Act, 21 U.S.C. section 360bbb-3(b)(1), unless the authorization is terminated or revoked sooner. Performed at [facility], [address], [place], [place]   Culture, Urine  Status: Abnormal   Collection Time: 11/22/18  1:16 AM   Specimen: Urine, Random  Result Value Ref Range Status   Specimen Description URINE, RANDOM  Final   Special Requests   Final    NONE Performed at [facility], [address], [place], [place] [address]    Culture MULTIPLE SPECIES PRESENT, SUGGEST RECOLLECTION (A)  Final   Report Status 11/22/2018 FINAL  Final  Culture, blood (routine x 2)     Status: None   Collection Time: 11/22/18  2:00 AM   Specimen: BLOOD  Result Value Ref Range Status   Specimen Description BLOOD SITE NOT SPECIFIED  Final   Special Requests   Final    BOTTLES DRAWN AEROBIC AND ANAEROBIC Blood Culture adequate volume   Culture   Final    NO GROWTH 5 DAYS Performed at [facility], [address], [place], [place] [address]    Report Status 11/27/2018 FINAL  Final  Culture, blood (routine x 2)     Status: None   Collection Time: 11/22/18  2:15 AM   Specimen: BLOOD  Result Value Ref Range Status   Specimen Description BLOOD SITE NOT SPECIFIED  Final   Special Requests   Final    BOTTLES DRAWN AEROBIC AND ANAEROBIC Blood Culture adequate volume   Culture   Final    NO GROWTH 5 DAYS Performed at [facility], [address] [place], [place] [address]    Report  Status 11/27/2018 FINAL  Final  CSF culture     Status: None   Collection Time: 11/23/18  3:57 PM   Specimen: PATH Cytology CSF; Cerebrospinal Fluid  Result Value Ref Range Status   Specimen Description CSF  Final   Special Requests NONE  Final   Gram Stain NO WBC SEEN NO ORGANISMS SEEN CYTOSPIN SMEAR   Final   Culture   Final    NO GROWTH 3 DAYS Performed at [facility], [address] [place], [place] [address]    Report Status 11/27/2018 FINAL  Final  Culture, Urine     Status: Abnormal   Collection Time: 11/24/18 12:12 PM   Specimen: Urine, Catheterized  Result Value Ref Range Status   Specimen Description URINE, CATHETERIZED  Final   Special Requests NONE  Final   Culture (A)  Final    <10,000 COLONIES/mL INSIGNIFICANT GROWTH Performed at [facility], [address] [place], [place] [address]    Report Status 11/25/2018 FINAL  Final         Radiology Studies: No results found.      Scheduled Meds: . aspirin EC  81 mg Oral Daily  . haloperidol  0.5 mg Oral BID  . insulin aspart  0-9 Units Subcutaneous Q4H  . levothyroxine  250 mcg Oral Q0600  . mometasone-formoterol  2 puff Inhalation BID  . nystatin   Topical BID   Continuous Infusions: . sodium chloride Stopped (11/24/18 0227)  . thiamine injection 500 mg (11/28/18 0908)   Followed by  . [START ON 11/29/2018] thiamine injection       LOS: 6 days    Time spent: 25 minutes    Alberteen Sam, MD [facility] 11/28/2018, 2:20 PM     Please page through AMION:  [email or web address] Password [email or web address] If 7PM-7AM, please contact night-coverage

## 2018-11-29 LAB — BASIC METABOLIC PANEL
Anion gap: 11 (ref 5–15)
BUN: 7 mg/dL — ABNORMAL LOW (ref 8–23)
CO2: 26 mmol/L (ref 22–32)
Calcium: 8.7 mg/dL — ABNORMAL LOW (ref 8.9–10.3)
Chloride: 102 mmol/L (ref 98–111)
Creatinine, Ser: 0.59 mg/dL (ref 0.44–1.00)
GFR calc Af Amer: >60 mL/min (ref >60)
GFR calc non Af Amer: >60 mL/min (ref >60)
Glucose, Bld: 182 mg/dL — ABNORMAL HIGH (ref 70–99)
Potassium: 3.9 mmol/L (ref 3.5–5.1)
Sodium: 139 mmol/L (ref 135–145)

## 2018-11-29 LAB — GLUCOSE, CAPILLARY
Glucose-Capillary: 143 mg/dL — ABNORMAL HIGH (ref 70–99)
Glucose-Capillary: 145 mg/dL — ABNORMAL HIGH (ref 70–99)
Glucose-Capillary: 180 mg/dL — ABNORMAL HIGH (ref 70–99)
Glucose-Capillary: 186 mg/dL — ABNORMAL HIGH (ref 70–99)
Glucose-Capillary: 197 mg/dL — ABNORMAL HIGH (ref 70–99)
Glucose-Capillary: 222 mg/dL — ABNORMAL HIGH (ref 70–99)

## 2018-11-29 LAB — CBC
HCT: 38.9 % (ref 36.0–46.0)
Hemoglobin: 12.8 g/dL (ref 12.0–15.0)
MCH: 29.2 pg (ref 26.0–34.0)
MCHC: 32.9 g/dL (ref 30.0–36.0)
MCV: 88.8 fL (ref 80.0–100.0)
Platelets: 247 10*3/uL (ref 150–400)
RBC: 4.38 MIL/uL (ref 3.87–5.11)
RDW: 13.1 % (ref 11.5–15.5)
WBC: 6.8 10*3/uL (ref 4.0–10.5)
nRBC: 0 % (ref 0.0–0.2)

## 2018-11-29 MED ORDER — VITAMIN B-1 100 MG PO TABS: 100.0000 mg | ORAL_TABLET | Freq: Every day | ORAL | 3 refills | Status: DC

## 2018-11-29 NOTE — Progress Notes (Addendum)
Physical Therapy Treatment Patient Details Name: Anna Rojas MRN: [id number] DOB: [birth year] Today's Date: 11/29/2018    History of Present Illness Shaunessy Dobratz is a 63 y.o. female with history of torsades status post ICD placement, history of complete heart block status post pacemaker placement, diabetes mellitus, hypothyroidism was brought to the ER patient was found to be confused by patient's family. Pt with history of alcohol abuse but has been sober for 6 months until she had 7 beers 2 days ago. Pt found to have encephalopathy, Underwent LP to rule out meningitis.    PT Comments    Pt performed gait and transfer training with decreased assistance.  She is more alert and focused and she is functioning at a supervision/ min guard assistance level.  Pt is eager to d/c home.  Based on her progress will inform supervising PT of need for change in recommendations. Plan now for d/c home with HHPT.       Follow Up Recommendations  Home health PT     Equipment Recommendations  Rolling walker with 5" wheels;3in1 (PT)    Recommendations for Other Services       Precautions / Restrictions Precautions Precautions: Fall Precaution Comments: confused, garbled speech at times Restrictions Weight Bearing Restrictions: No    Mobility  Bed Mobility Overal bed mobility: Needs Assistance Bed Mobility: Supine to Sit     Supine to sit: Modified independent (Device/Increase time)     General bed mobility comments: No assistance needed to move to edge of bed.  Transfers Overall transfer level: Needs assistance Equipment used: Rolling walker (2 wheeled) Transfers: Sit to/from Stand Sit to Stand: Supervision         General transfer comment: Cues for safety to push from seated surface and back entirely to seated surface.  Ambulation/Gait Ambulation/Gait assistance: Min guard Gait Distance (Feet): 120 Feet Assistive device: Rolling walker (2 wheeled) Gait Pattern/deviations:  Step-through pattern;Decreased stride length;Trunk flexed     General Gait Details: Cues for RW position and to stay close to device.  Cues for upper trunk control.   Stairs             Wheelchair Mobility    Modified Rankin (Stroke Patients Only)       Balance Overall balance assessment: Needs assistance   Sitting balance-Leahy Scale: Good   Postural control: Posterior lean   Standing balance-Leahy Scale: Poor                              Cognition Arousal/Alertness: Awake/alert Behavior During Therapy: Impulsive;WFL for tasks assessed/performed Overall Cognitive Status: Within Functional Limits for tasks assessed                                 General Comments: Pt more alert and less impulsive.  She is following commands better today.      Exercises      General Comments        Pertinent Vitals/Pain Pain Assessment: No/denies pain Faces Pain Scale: Hurts a little bit Pain Location: B feet Pain Descriptors / Indicators: Discomfort Pain Intervention(s): Monitored during session;Repositioned    Home Living                      Prior Function            PT Goals (current goals can now be found in  the care plan section) Acute Rehab PT Goals Patient Stated Goal: none stated Potential to Achieve Goals: Fair Progress towards PT goals: Progressing toward goals    Frequency    Min 3X/week      PT Plan Discharge plan needs to be updated    Co-evaluation              AM-PAC PT "6 Clicks" Mobility   Outcome Measure  Help needed turning from your back to your side while in a flat bed without using bedrails?: None Help needed moving from lying on your back to sitting on the side of a flat bed without using bedrails?: None Help needed moving to and from a bed to a chair (including a wheelchair)?: A Little Help needed standing up from a chair using your arms (e.g., wheelchair or bedside chair)?: A  Little Help needed to walk in hospital room?: A Little Help needed climbing 3-5 steps with a railing? : A Little 6 Click Score: 20    End of Session Equipment Utilized During Treatment: Gait belt Activity Tolerance: Patient tolerated treatment well Patient left: with call bell/phone within reach;with nursing/sitter in room;in chair;with chair alarm set Nurse Communication: Mobility status PT Visit Diagnosis: Unsteadiness on feet (R26.81);Muscle weakness (generalized) (M62.81);Difficulty in walking, not elsewhere classified (R26.2)     Time: 2992-4268 PT Time Calculation (min) (ACUTE ONLY): 23 min  Charges:  $Gait Training: 8-22 mins $Therapeutic Activity: 8-22 mins                     Joycelyn Rua, PTA Acute Rehabilitation Services Pager [phone] Office [phone]     Lanard Arguijo Artis Delay 11/29/2018, 2:31 PM

## 2018-11-29 NOTE — TOC Transition Note (Signed)
Transition of Care [facility]) - CM/SW Discharge Note   Patient Details  Name: Anna Rojas MRN: [id number] Date of Birth: [birth year]  Transition of Care [facility]) CM/SW Contact:  Geralynn Ochs, LCSW Phone Number: 11/29/2018, 3:43 PM   Clinical Narrative:   Patient now oriented, refusing SNF. CSW discussed recommendation for SNF, and patient says that she feels better and has dogs at home that she has to take care of while her fiance is driving truck. Patient discussed how it feels silly that this whole thing was caused by a vitamin deficiency, but she feels so much better now. CSW set patient up with home health through [facility]. No equipment needed, patient already has a walker and 3N1 at home. Fiance will come and pick her up this evening.    Final next level of care: [place] Barriers to Discharge: Barriers Resolved   Patient Goals and CMS Choice Patient states their goals for this hospitalization and ongoing recovery are:: patient unable to state goals due to altered mental status CMS Medicare.gov Compare Post Acute Care list provided to:: Patient Represenative (must comment) Choice offered to / list presented to : Spouse  Discharge Placement                       Discharge Plan and Services     Post Acute Care Choice: Skilled Nursing Facility                    HH Arranged: PT, OT, Speech Therapy HH Agency: [facility] Date [place]: 11/29/18 Time [place]: [phone] Representative spoke with at [place]: [place] ([place]) Interventions     Readmission Risk Interventions No flowsheet data found.

## 2018-11-29 NOTE — Progress Notes (Addendum)
Neurology Progress Note   S:// Patient awake, alert in bed. Oriented x 3.   O:// Current vital signs: BP 111/75 (BP Location: Left Arm)   Pulse 73   Temp 98 F (36.7 C) (Oral)   Resp 16   Ht 5\' 1"  (1.549 m)   Wt 81.6 kg   SpO2 97%   BMI 34.01 kg/m  Vital signs in last 24 hours: Temp:  [97.6 F (36.4 C)-98.2 F (36.8 C)] 98 F (36.7 C) (10/09 1119) Pulse Rate:  [71-85] 73 (10/09 1119) Resp:  [16-18] 16 (10/09 1119) BP: (111-146)/(62-99) 111/75 (10/09 1119) SpO2:  [95 %-100 %] 97 % (10/09 1119)   General: awake, alert HEENT: Cephalic atraumatic Lungs: Clear to auscultation Vascular: Regular rate rhythm Abdomen: Soft nondistended nontender Extremities warm well perfused  Neurological exam Mental status: Awake alert oriented person, place, month.  Language and speech: Speech is mildly dysarthric.  She is able to repeat some sentences fluently. No errors today.   Cranial nerves: Pupils equal round reactive to light, extraocular movements intact, facial sensation intact, face symmetric, tongue and uvula midline. Motor exam: Antigravity in all fours Sensory exam intact light touch Coordination: No dysmetria  Medications  Current Facility-Administered Medications:  .  0.9 %  sodium chloride infusion, , Intravenous, PRN, 05-03-1997, MD, Stopped at 11/24/18 0227 .  acetaminophen (TYLENOL) tablet 650 mg, 650 mg, Oral, Q4H PRN **OR** acetaminophen (TYLENOL) solution 650 mg, 650 mg, Per Tube, Q4H PRN **OR** acetaminophen (TYLENOL) suppository 650 mg, 650 mg, Rectal, Q4H PRN, 01/24/19, MD, 650 mg at 11/22/18 0106 .  aspirin EC tablet 81 mg, 81 mg, Oral, Daily, [place], 01/22/19, MD, 81 mg at 11/29/18 0835 .  haloperidol (HALDOL) tablet 0.5 mg, 0.5 mg, Oral, BID, 01/29/19, NP, 0.5 mg at 11/27/18 1125 .  insulin aspart (novoLOG) injection 0-9 Units, 0-9 Units, Subcutaneous, Q4H, 01/27/19, MD, 2 Units at 11/29/18 [phone] .  levothyroxine  (SYNTHROID) tablet 250 mcg, 250 mcg, Oral, Q0600, 0254, MD, 250 mcg at 11/29/18 [phone] .  mometasone-formoterol (DULERA) 200-5 MCG/ACT inhaler 2 puff, 2 puff, Inhalation, BID, 2706, MD, 2 puff at 11/29/18 0830 .  nystatin (MYCOSTATIN/NYSTOP) topical powder, , Topical, BID, 01/29/19, MD .  [COMPLETED] thiamine 500mg  in normal saline (68ml) IVPB, 500 mg, Intravenous, TID, Last Rate: 100 mL/hr at 11/28/18 2236, 500 mg at 11/28/18 2236 **FOLLOWED BY** thiamine (B-1) 250 mg in sodium chloride 0.9 % 50 mL IVPB, 250 mg, Intravenous, Q24H, [place], 01/28/19, MD, Last Rate: 100 mL/hr at 11/29/18 0839, 250 mg at 11/29/18 0839 Labs CBC    Component Value Date/Time   WBC 6.8 11/29/2018 0450   RBC 4.38 11/29/2018 0450   HGB 12.8 11/29/2018 0450   HCT 38.9 11/29/2018 0450   PLT 247 11/29/2018 0450   MCV 88.8 11/29/2018 0450   MCH 29.2 11/29/2018 0450   MCHC 32.9 11/29/2018 0450   RDW 13.1 11/29/2018 0450   LYMPHSABS 1.4 11/21/2018 1804   MONOABS 0.7 11/21/2018 1804   EOSABS 0.1 11/21/2018 1804   BASOSABS 0.0 11/21/2018 1804    CMP     Component Value Date/Time   NA 139 11/29/2018 0450   K 3.9 11/29/2018 0450   CL 102 11/29/2018 0450   CO2 26 11/29/2018 0450   GLUCOSE 182 (H) 11/29/2018 0450   BUN 7 (L) 11/29/2018 0450   CREATININE 0.59 11/29/2018 0450   CALCIUM 8.7 (L) 11/29/2018 0450  PROT 6.0 (L) 11/23/2018 1033   ALBUMIN 2.7 (L) 11/23/2018 1033   AST 38 11/23/2018 1033   ALT 26 11/23/2018 1033   ALKPHOS 96 11/23/2018 1033   BILITOT 0.7 11/23/2018 1033   GFRNONAA >60 11/29/2018 0450   GFRAA >60 11/29/2018 0450   maging No new imaging to review  Assessment: 63 year old presenting with altered mental status of unclear etiology.  She had her first drinks 6-7 beers after months of abstaining.  Has a prior history of chronic alcoholism. Initial concern for stroke versus CNS infection-both ruled out with MRI and CSF analysis including HSV PCR which  was negative. The next consideration on the Differential was seizures.  Long-term EEG did not show any seizure or seizure-like activity. Continues to make off-and-on paraphasic errors and is not at her baseline but I suspect that this is likely Wernicke's encephalopathy from long-term alcohol use.  Impression: Likely Wernicke's encephalopathy  Recommendations: Continue aggressive thiamine replacement Refer to Outpatient neurology on discharge for a formal neuropsych testing D/w Dr. Loleta Books on the floor.  -- Anna Portland, MD [facility] Pager: [phone] If 7pm to 7am, please call on call as listed on AMION.

## 2018-11-29 NOTE — Progress Notes (Signed)
Inpatient Diabetes Program Recommendations  AACE/ADA: New Consensus Statement on Inpatient Glycemic Control (2015)  Target Ranges:  Prepandial:   less than 140 mg/dL      Peak postprandial:   less than 180 mg/dL (1-2 hours)      Critically ill patients:  140 - 180 mg/dL   Lab Results  Component Value Date   GLUCAP 197 (H) 11/29/2018   HGBA1C 11.1 (H) 11/22/2018    Review of Glycemic Control Results for MAIRLYN, TEGTMEYER (MRN [id number]) as of 11/29/2018 15:19  Ref. Range 11/28/2018 20:38 11/28/2018 23:48 11/29/2018 03:18 11/29/2018 08:20 11/29/2018 11:22  Glucose-Capillary Latest Ref Range: 70 - 99 mg/dL 222 (H) 145 (H) 143 (H) 180 (H) 197 (H)   Diabetes history: DM 2 Outpatient Diabetes medications:  Metformin 500 mg bid, Amaryl 2-4 mg bid, Tresiba 10 units q HS Current orders for Inpatient glycemic control:  Novolog sensitive q 4 hours Inpatient Diabetes Program Recommendations:    Saw patient to discuss current A1C.  Patient states that 11.1 is an improvement from her last A1C which was in the 13% range.  She states that she thinks she needs rapid acting insulin with meals to help her blood sugars.  Explained that this would take 3 extra shots/day and she states "that would be fine".  Encouraged patient to f/u with PCP for medication changes.  Also explained importance of avoiding hypoglycemia.  Patient verbalized understanding.   Thanks  Adah Perl, RN, BC-ADM Inpatient Diabetes Coordinator Pager [phone] ([phone])

## 2018-11-29 NOTE — Discharge Summary (Signed)
Physician Discharge Summary  Anna Rojas WJX:[id number] DOB: May 08, 1955 DOA: 11/21/2018  PCP: Lynn Ito, MD  Admit date: 11/21/2018 Discharge date: 11/29/2018  Admitted From: Home  Disposition:  Home   Recommendations for Outpatient Follow-up:  1. Follow up with PCP in 1-2 weeks 2. Please obtain BMP/CBC in one week 3. Dr. Katrinka Blazing: Please review amitriptyline changes and thiamine regimen; if symptom free and alcohol free in three months, stop thiamine 4. Dr. Katrinka Blazing: Please refer for sleep study if patient willing     Home Health: PT/OT  Equipment/Devices: Rolling walker, 3-in-1  Discharge Condition: Good  CODE STATUS: FULL Diet recommendation: Diabetic  Brief/Interim Summary: Anna Rojas is a 63 y.o. F with hypothyroidism, DM, depression, asthma, restrictive lung disease, hx torsades s/p ICD and hx CHB s/p pacer who presented with acute confusion.  Had last been heard from by family a few days prior, seemed normal by phone.  On day of admission, family hadn't been able to reach her, came to house and she was confused.  Family reported at admission that she had had 6-7 beers the day before after a long period of sobriety.  In the ER, initially CODE STROKE.  CT head unremarkable.  EtOH negative.  Utox negative.  Febrile but WBC 8K, Cr and electrolytes normal.         PRINCIPAL HOSPITAL DIAGNOSIS: Acute metabolic encephalopathy, suspected Wernicke's encephalopathy    Discharge Diagnoses:    Acute metabolic encephalopathy, suspect Wernicke's encephalopathy Patient initially confused, not following commands, incoherent.    MR brain unremarkable.  LP obtained, showed slightly elevated protein (non-diagnostic in a diabetic) but 1RBC, no WBCs, ruling out encephalitis and meningitis.  CSF VDRL and HSV negative.  After the initial concern was for stroke, seizure or encephalitis, stroke was ruled out with MRI, off-and-on seizure was ruled out with prolonged EEG, and encephalitis was  ruled out with LP.    She subsequently was noted to have more prominent oculomotor dysfunction and fluent aphasia, and was believed to have Wernicke's.  She was started on high dose IV thiamine and actually appeared to have subsequent marked improvement in mentation.    -Continue oral thiamine and follow up with PCP -Given that amitriptyline can cause confusion as anticholinergic side effect, this was stopped; defer to PCP to cautiously reintroduce if needed.    Fever Fever 102F in ER, without localizing signs.  Intracranial infection ruled out with LP.  CXR clear, UA showed multiple species.  Received 4 days empiric antibiotics for meningitis, repeat urine culture no growth.  Blood cultures no growth.  No urinary or respiratory symptoms when patient finally awoke.  Suspect this was a treated UTI, possibly a URI.  Hypoxia while sleeping Patient noted to have hypoxia while sleeping, intermittent bradypneas.  ABG showed hypoxia while sleeping.   CXR clear.  O2 sat normal while awake.   -Outpatient sleep study recommended  Uncontrolled DM A1c 11% on admission. Glucoses controlled here easily with low dose SSI.  Hypothyroidism TSH undetectable at admission, repeat TSH and free T4 normal.  Depression  Asthma No active disease  History CHB with pacer History torsades with ICD Paced rhythm on tele            Discharge Instructions  Discharge Instructions    Discharge instructions   Complete by: As directed    From Dr. Maryfrances Bunnell: You were admitted for confusion. We were able to show from imaging of the brain with an MRI that you did not have a stroke.  We tested your for seizures and ruled this out too.  Lastly, we did a spinal tap and proved that you did not have any form of encephalitis or meningitis.  My best guess (given that you got better so quickly with thiamine) is that you had "Wernicke's encephalopathy" which is a form of thiamine deficiency.  Take thiamine (vitamin  B1) 100 mcg daily in the morning for the next three months, then talk to your doctor about if you should continue after that Stop using all alcohol  There is an outside chance that your symptoms were worsened by amitriptyline/Elavil  For this reason, I recommend you stop taking amitriptyline/Elavil from now on.  Call your primary care doctor for a follow up appointment.  Resume all your other home medicines.   Increase activity slowly   Complete by: As directed      Allergies as of 11/29/2018      Reactions   Beclomethasone Itching, Rash, Other (See Comments)   Flushing skin redness, also   Brimonidine Hives, Itching   Codeine Hives, Rash   Darvon [propoxyphene] Hives   Iodinated Diagnostic Agents Hives   Iodine-131 Hives   Oxycodone Hives, Rash   Tranexamic Acid Itching, Rash   Ceftriaxone Sodium In Dextrose Rash   12/29/17 Developed diffuse pruritic rash on chest, face, hips, arms after finishing rocephin dose.  Patient did have rocephin in 2017 without a problem though... 11/22/2018 - unable to tolerate retrial of ceftriaxone, rash developed after dose.    Norethindrone Rash   Brimonidine Tartrate Hives, Itching   Celecoxib Itching, Rash   Oxycontin [oxycodone Hcl] Hives, Rash      Medication List    STOP taking these medications   amitriptyline 100 MG tablet Commonly known as: ELAVIL     TAKE these medications   aspirin EC 81 MG tablet Take 81 mg by mouth daily.   citalopram 20 MG tablet Commonly known as: CELEXA Take 20 mg by mouth daily.   furosemide 20 MG tablet Commonly known as: LASIX Take 40 mg by mouth daily.   glimepiride 4 MG tablet Commonly known as: AMARYL Take 2-4 mg by mouth See admin instructions. Take 2-4 mg by mouth two times a day- 2 mg if BGL is >100 and 4 mg if BGL is >250   lactulose 10 GM/15ML solution Commonly known as: CHRONULAC Take 20 g by mouth 2 (two) times daily.   metFORMIN 500 MG tablet Commonly known as: GLUCOPHAGE Take  500 mg by mouth 2 (two) times daily with a meal.   omeprazole 40 MG capsule Commonly known as: PRILOSEC Take 40 mg by mouth daily.   OXYGEN Inhale into the lungs as needed for shortness of breath.   potassium chloride SA 20 MEQ tablet Commonly known as: KLOR-CON Take 20 mEq by mouth daily.   Symbicort 160-4.5 MCG/ACT inhaler Generic drug: budesonide-formoterol Inhale 2 puffs into the lungs 2 (two) times daily.   Synthroid 300 MCG tablet Generic drug: levothyroxine Take 300 mcg by mouth daily before breakfast.   thiamine 100 MG tablet Commonly known as: VITAMIN B-1 Take 1 tablet (100 mg total) by mouth daily.   Evaristo Bury FlexTouch 100 UNIT/ML Sopn FlexTouch Pen Generic drug: insulin degludec Inject 10 Units into the skin at bedtime.      Follow-up Information    [facility] Follow up.   Specialty: Home Health Services Why: Representative with [facility] will call to schedule start of home health services. Contact information: [address] [place] [address] [phone]        Lynn Ito, MD Follow up.   Specialty: Internal Medicine Why: Call for a follow up appointment with your PCP Contact information: [address] [place] [address] [phone]          Allergies  Allergen Reactions  . Beclomethasone Itching, Rash and Other (See Comments)    Flushing skin redness, also  . Brimonidine Hives and Itching  . Codeine Hives and Rash  . Darvon [Propoxyphene] Hives  . Iodinated Diagnostic Agents Hives  . Iodine-131 Hives  . Oxycodone Hives and Rash  . Tranexamic Acid Itching and Rash  . Ceftriaxone Sodium In Dextrose Rash    12/29/17 Developed diffuse pruritic rash on chest, face, hips, arms after finishing rocephin dose.  Patient did have rocephin in 2017 without a problem though...  11/22/2018 - unable to tolerate retrial of ceftriaxone, rash developed after dose.   . Norethindrone Rash  . Brimonidine  Tartrate Hives and Itching  . Celecoxib Itching and Rash  . Oxycontin [Oxycodone Hcl] Hives and Rash    Consultations:  Neurology   Procedures/Studies: Ct Angio Head W Or Wo Contrast  Result Date: 11/21/2018 CLINICAL DATA:  Focal neuro deficit greater than 6 hours. Right-sided weakness and aphasia. EXAM: CT ANGIOGRAPHY HEAD AND NECK CT PERFUSION BRAIN TECHNIQUE: Multidetector CT imaging of the head and neck was performed using the standard protocol during bolus administration of intravenous contrast. Multiplanar CT image reconstructions and MIPs were obtained to evaluate the vascular anatomy. Carotid stenosis measurements (when applicable) are obtained utilizing NASCET criteria, using the distal internal carotid diameter as the denominator. Multiphase CT imaging of the brain was performed following IV bolus contrast injection. Subsequent parametric perfusion maps were calculated using RAPID software. CONTRAST:  OMNIPAQUE IOHEXOL 350 MG/ML SOLN COMPARISON:  CT head 11/21/2018 FINDINGS: CTA NECK FINDINGS Aortic arch: Standard branching. Imaged portion shows no evidence of aneurysm or dissection. No significant stenosis of the major arch vessel origins. Right carotid system: No significant right carotid stenosis. Small calcific plaque at the origin of the right external carotid artery. Left carotid system: Mild atherosclerotic disease left carotid bifurcation. No significant left carotid stenosis. Vertebral arteries: Both vertebral arteries patent to the basilar without significant stenosis. Skeleton: Mild degenerative changes in the cervical spine. No acute skeletal abnormality. Other neck: Negative for mass or adenopathy. Upper chest: Left-sided pacemaker. Lung apices clear bilaterally. Review of the MIP images confirms the above findings CTA HEAD FINDINGS Anterior circulation: Cavernous carotid widely patent bilaterally without significant calcification or stenosis. Anterior and middle cerebral  arteries widely patent bilaterally. Posterior circulation: Both vertebral arteries patent to the basilar. PICA patent bilaterally. Basilar widely patent. Superior cerebellar and posterior cerebral arteries patent bilaterally. Venous sinuses: Minimal venous contrast due to timing of the scan Anatomic variants: None Review of the MIP images confirms the above findings CT Brain Perfusion Findings: ASPECTS: 10 CBF (<30%) Volume: 0mL Perfusion (Tmax>6.0s) volume: 0mL Mismatch Volume: 0mL Infarction Location:None IMPRESSION: 1. CT perfusion negative for acute ischemia or infarct.  Aspect 10 2. No significant carotid or vertebral artery stenosis in the neck. 3. No significant cranial stenosis or large vessel occlusion. Electronically Signed   By: Marlan Palau M.D.   On: 11/21/2018 18:54   Ct Angio Neck W Or Wo Contrast  Result Date: 11/21/2018 CLINICAL DATA:  Focal neuro deficit greater than 6 hours. Right-sided weakness and aphasia. EXAM: CT ANGIOGRAPHY HEAD AND NECK CT PERFUSION BRAIN  TECHNIQUE: Multidetector CT imaging of the head and neck was performed using the standard protocol during bolus administration of intravenous contrast. Multiplanar CT image reconstructions and MIPs were obtained to evaluate the vascular anatomy. Carotid stenosis measurements (when applicable) are obtained utilizing NASCET criteria, using the distal internal carotid diameter as the denominator. Multiphase CT imaging of the brain was performed following IV bolus contrast injection. Subsequent parametric perfusion maps were calculated using RAPID software. CONTRAST:  130mL OMNIPAQUE IOHEXOL 350 MG/ML SOLN COMPARISON:  CT head 11/21/2018 FINDINGS: CTA NECK FINDINGS Aortic arch: Standard branching. Imaged portion shows no evidence of aneurysm or dissection. No significant stenosis of the major arch vessel origins. Right carotid system: No significant right carotid stenosis. Small calcific plaque at the origin of the right external carotid  artery. Left carotid system: Mild atherosclerotic disease left carotid bifurcation. No significant left carotid stenosis. Vertebral arteries: Both vertebral arteries patent to the basilar without significant stenosis. Skeleton: Mild degenerative changes in the cervical spine. No acute skeletal abnormality. Other neck: Negative for mass or adenopathy. Upper chest: Left-sided pacemaker. Lung apices clear bilaterally. Review of the MIP images confirms the above findings CTA HEAD FINDINGS Anterior circulation: Cavernous carotid widely patent bilaterally without significant calcification or stenosis. Anterior and middle cerebral arteries widely patent bilaterally. Posterior circulation: Both vertebral arteries patent to the basilar. PICA patent bilaterally. Basilar widely patent. Superior cerebellar and posterior cerebral arteries patent bilaterally. Venous sinuses: Minimal venous contrast due to timing of the scan Anatomic variants: None Review of the MIP images confirms the above findings CT Brain Perfusion Findings: ASPECTS: 10 CBF (<30%) Volume: 20mL Perfusion (Tmax>6.0s) volume: 84mL Mismatch Volume: 52mL Infarction Location:None IMPRESSION: 1. CT perfusion negative for acute ischemia or infarct.  Aspect 10 2. No significant carotid or vertebral artery stenosis in the neck. 3. No significant cranial stenosis or large vessel occlusion. Electronically Signed   By: Franchot Gallo M.D.   On: 11/21/2018 18:54   Mr Jeri Cos UV Contrast  Result Date: 11/23/2018 CLINICAL DATA:  Altered mental status. Encephalopathy. EXAM: MRI HEAD WITHOUT AND WITH CONTRAST TECHNIQUE: Multiplanar, multiecho pulse sequences of the brain and surrounding structures were obtained without and with intravenous contrast. CONTRAST:  55mL GADAVIST GADOBUTROL 1 MMOL/ML IV SOLN COMPARISON:  None. FINDINGS: Examination is degraded by motion. BRAIN: There is no acute infarct, acute hemorrhage or extra-axial collection. The white matter signal is normal  for the patient's age. There is generalized atrophy without lobar predilection. The midline structures are normal. The hippocampi are normal and symmetric in size and signal. The hypothalamus and mamillary bodies are normal. There is no cortical ectopia or dysplasia. No abnormal contrast enhancement. VASCULAR: The major intracranial arterial and venous sinus flow voids are normal. Susceptibility-sensitive sequences show no chronic microhemorrhage or superficial siderosis. SKULL AND UPPER CERVICAL SPINE: Calvarial bone marrow signal is normal. There is no skull base mass. The visualized upper cervical spine and soft tissues are normal. SINUSES/ORBITS: There are no fluid levels or advanced mucosal thickening. The mastoid air cells and middle ear cavities are free of fluid. The orbits are normal. IMPRESSION: 1. No acute intracranial abnormality. 2. Generalized atrophy without lobar predilection. 3. No identifiable seizure etiology. Electronically Signed   By: Ulyses Jarred M.D.   On: 11/23/2018 00:14   Ct C-spine No Charge  Result Date: 11/21/2018 CLINICAL DATA:  63 year old female code stroke presentation today. Right side weakness. EXAM: CT CERVICAL SPINE WITH CONTRAST TECHNIQUE: Multiplanar CT images of the cervical spine were reconstructed from contemporary  CTA of the Neck. CONTRAST:  No additional COMPARISON:  CT head, CTA head and neck, and CT perfusion earlier today. FINDINGS: Alignment: Straightening of cervical lordosis. Cervicothoracic junction alignment is within normal limits. Bilateral posterior element alignment is within normal limits. Skull base and vertebrae: Mild motion degradation at C2. Visualized skull base is intact. No atlanto-occipital dissociation. No acute osseous abnormality identified. Soft tissues and spinal canal: No prevertebral fluid or swelling. No visible canal hematoma. Neck soft tissues reported with the CTA today separately. Disc levels: Widespread cervical spine degeneration.  There is ankylosis of the C2-C3 posterior elements on the right. There is advanced disc and endplate degeneration C4-C5 through C6-C7. 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 Subsequently, there is mild and up to moderate multilevel cervical spinal stenosis, likely maximal at C5-C6. Upper chest: Upper thoracic levels appear intact. Other chest findings reported on the CTA today separately. IMPRESSION: 1. No acute osseous abnormality in the cervical spine. 2. 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 Degenerative cervical spinal stenosis from C4-C5 to C6-C7, up to moderate at C5-C6. 3. CTA head and neck today reported separately. Electronically Signed   By: Odessa Fleming M.D.   On: 11/21/2018 21:00   Ct Cerebral Perfusion W Contrast  Result Date: 11/21/2018 CLINICAL DATA:  Focal neuro deficit greater than 6 hours. Right-sided weakness and aphasia. EXAM: CT ANGIOGRAPHY HEAD AND NECK CT PERFUSION BRAIN TECHNIQUE: Multidetector CT imaging of the head and neck was performed using the standard protocol during bolus administration of intravenous contrast. Multiplanar CT image reconstructions and MIPs were obtained to evaluate the vascular anatomy. Carotid stenosis measurements (when applicable) are obtained utilizing NASCET criteria, using the distal internal carotid diameter as the denominator. Multiphase CT imaging of the brain was performed following IV bolus contrast injection. Subsequent parametric perfusion maps were calculated using RAPID software. CONTRAST:  OMNIPAQUE IOHEXOL 350 MG/ML SOLN COMPARISON:  CT head 11/21/2018 FINDINGS: CTA NECK FINDINGS Aortic arch: Standard branching. Imaged portion shows no evidence of aneurysm or dissection. No significant stenosis of the major arch vessel origins. Right carotid system: No significant right carotid stenosis. Small calcific plaque at the origin of the right external carotid artery. Left  carotid system: Mild atherosclerotic disease left carotid bifurcation. No significant left carotid stenosis. Vertebral arteries: Both vertebral arteries patent to the basilar without significant stenosis. Skeleton: Mild degenerative changes in the cervical spine. No acute skeletal abnormality. Other neck: Negative for mass or adenopathy. Upper chest: Left-sided pacemaker. Lung apices clear bilaterally. Review of the MIP images confirms the above findings CTA HEAD FINDINGS Anterior circulation: Cavernous carotid widely patent bilaterally without significant calcification or stenosis. Anterior and middle cerebral arteries widely patent bilaterally. Posterior circulation: Both vertebral arteries patent to the basilar. PICA patent bilaterally. Basilar widely patent. Superior cerebellar and posterior cerebral arteries patent bilaterally. Venous sinuses: Minimal venous contrast due to timing of the scan Anatomic variants: None Review of the MIP images confirms the above findings CT Brain Perfusion Findings: ASPECTS: 10 CBF (<30%) Volume: 0mL Perfusion (Tmax>6.0s) volume: 0mL Mismatch Volume: 0mL Infarction Location:None IMPRESSION: 1. CT perfusion negative for acute ischemia or infarct.  Aspect 10 2. No significant carotid or vertebral artery stenosis in the neck. 3. No significant cranial stenosis or large vessel occlusion. Electronically Signed   By: Marlan Palau M.D.   On: 11/21/2018 18:54   Dg Chest Port 1 View  Result Date: 11/22/2018 CLINICAL DATA:  Altered mental status, fever EXAM: PORTABLE CHEST 1  VIEW COMPARISON:  Radiograph August 19, 2018 FINDINGS: Dual lead pacer/defibrillator pack overlies the left chest wall with leads at the right atrium and cardiac apex. No consolidation, features of edema, pneumothorax, or effusion. Pulmonary vascularity is normally distributed. The cardiomediastinal contours are unremarkable. No acute osseous or soft tissue abnormality. IMPRESSION: No acute cardiopulmonary  abnormality. Electronically Signed   By: Kreg Shropshire M.D.   On: 11/22/2018 01:49   Ct Head Code Stroke Wo Contrast  Result Date: 11/21/2018 CLINICAL DATA:  Code stroke. Focal neuro deficit greater than 6 hours. Rule out stroke. EXAM: CT HEAD WITHOUT CONTRAST TECHNIQUE: Contiguous axial images were obtained from the base of the skull through the vertex without intravenous contrast. COMPARISON:  None. FINDINGS: Brain: Mild atrophy. Negative for acute infarct, hemorrhage, mass. Mild white matter changes appear chronic. Vascular: Negative for hyperdense vessel Skull: Negative Sinuses/Orbits: Negative Other: None ASPECTS (Alberta Stroke Program Early CT Score) - Ganglionic level infarction (caudate, lentiform nuclei, internal capsule, insula, M1-M3 cortex): 7 - Supraganglionic infarction (M4-M6 cortex): 3 Total score (0-10 with 10 being normal): 10 IMPRESSION: 1. No acute abnormality 2. ASPECTS is 10 3. These results were called by telephone at the time of interpretation on 11/21/2018 at 6:35 pm to provider Amada Jupiter MD , who verbally acknowledged these results. Electronically Signed   By: Marlan Palau M.D.   On: 11/21/2018 18:37   Dg Fl Guided Lumbar Puncture  Result Date: 11/23/2018 CLINICAL DATA:  Inpatient. Altered mental status. Concern for CNS infection. EXAM: DIAGNOSTIC LUMBAR PUNCTURE UNDER FLUOROSCOPIC GUIDANCE FLUOROSCOPY TIME:  Fluoroscopy Time:  0 minutes 48 seconds Radiation Exposure Index (if provided by the fluoroscopic device): 17.8 mGy Number of Acquired Spot Images: 0 PROCEDURE: Informed consent was obtained from the patient's fiance prior to the procedure, including potential complications of headache, allergy, and pain. With the patient prone, the lower back was prepped with Betadine. 1% Lidocaine was used for local anesthesia. Lumbar puncture was performed at the L2-3 level using a 20 gauge needle with return of clear CSF. Opening pressure could not be obtained due to very slow CSF flow.  8 ml of CSF were obtained for laboratory studies. The patient tolerated the procedure well and there were no apparent complications. IMPRESSION: Technically successful diagnostic lumbar puncture under fluoroscopic guidance. Electronically Signed   By: Delbert Phenix M.D.   On: 11/23/2018 16:09       Subjective: Feeling well.  No dyspna, cough, chest pain.  No confusion, fever.  No dysuria, abdominal pain.  Discharge Exam: Vitals:   11/29/18 0831 11/29/18 1119  BP:  111/75  Pulse:  73  Resp:  16  Temp:  98 F (36.7 C)  SpO2: 96% 97%   Vitals:   11/29/18 0320 11/29/18 0824 11/29/18 0831 11/29/18 1119  BP: 119/62 113/69  111/75  Pulse: 74 71  73  Resp: 17 16  16   Temp: 97.6 F (36.4 C) 97.8 F (36.6 C)  98 F (36.7 C)  TempSrc: Oral Oral  Oral  SpO2: 96% 96% 96% 97%  Weight:      Height:        General: Pt is alert, awake, not in acute distress Cardiovascular: RRR, nl S1-S2, no murmurs appreciated.   No LE edema.   Respiratory: Normal respiratory rate and rhythm.  CTAB without rales or wheezes. Abdominal: Abdomen soft and non-tender.  No distension or HSM.   Neuro/Psych: Strength symmetric in upper and lower extremities.  Judgment and insight appear normal.   The results  of significant diagnostics from this hospitalization (including imaging, microbiology, ancillary and laboratory) are listed below for reference.     Microbiology: Recent Results (from the past 240 hour(s))  SARS Coronavirus 2 [facility] order, Performed in [facility] hospital lab) Nasopharyngeal Nasopharyngeal Swab     Status: None   Collection Time: 11/21/18  6:40 PM   Specimen: Nasopharyngeal Swab  Result Value Ref Range Status   SARS Coronavirus 2 NEGATIVE NEGATIVE Final    Comment: (NOTE) If result is NEGATIVE SARS-CoV-2 target nucleic acids are NOT DETECTED. The SARS-CoV-2 RNA is generally detectable in upper and lower  respiratory specimens during the acute phase of infection. The lowest   concentration of SARS-CoV-2 viral copies this assay can detect is 250  copies / mL. A negative result does not preclude SARS-CoV-2 infection  and should not be used as the sole basis for treatment or other  patient management decisions.  A negative result may occur with  improper specimen collection / handling, submission of specimen other  than nasopharyngeal swab, presence of viral mutation(s) within the  areas targeted by this assay, and inadequate number of viral copies  (<250 copies / mL). A negative result must be combined with clinical  observations, patient history, and epidemiological information. If result is POSITIVE SARS-CoV-2 target nucleic acids are DETECTED. The SARS-CoV-2 RNA is generally detectable in upper and lower  respiratory specimens dur ing the acute phase of infection.  Positive  results are indicative of active infection with SARS-CoV-2.  Clinical  correlation with patient history and other diagnostic information is  necessary to determine patient infection status.  Positive results do  not rule out bacterial infection or co-infection with other viruses. If result is PRESUMPTIVE POSTIVE SARS-CoV-2 nucleic acids MAY BE PRESENT.   A presumptive positive result was obtained on the submitted specimen  and confirmed on repeat testing.  While 2019 novel coronavirus  (SARS-CoV-2) nucleic acids may be present in the submitted sample  additional confirmatory testing may be necessary for epidemiological  and / or clinical management purposes  to differentiate between  SARS-CoV-2 and other Sarbecovirus currently known to infect humans.  If clinically indicated additional testing with an alternate test  methodology [phone]) is advised. The SARS-CoV-2 RNA is generally  detectable in upper and lower respiratory sp ecimens during the acute  phase of infection. The expected result is Negative. Fact Sheet for Patients:  BoilerBrush.com.cy Fact Sheet  for Healthcare Providers: https://pope.com/ This test is not yet approved or cleared by the [place] FDA and has been authorized for detection and/or diagnosis of SARS-CoV-2 by FDA under an Emergency Use Authorization (EUA).  This EUA will remain in effect (meaning this test can be used) for the duration of the COVID-19 declaration under Section 564(b)(1) of the Act, 21 U.S.C. section 360bbb-3(b)(1), unless the authorization is terminated or revoked sooner. Performed at [facility], [address] [place], [place] [address]   Culture, Urine     Status: Abnormal   Collection Time: 11/22/18  1:16 AM   Specimen: Urine, Random  Result Value Ref Range Status   Specimen Description URINE, RANDOM  Final   Special Requests   Final    NONE Performed at [facility], [address], [place], [place] [address]    Culture MULTIPLE SPECIES PRESENT, SUGGEST RECOLLECTION (A)  Final   Report Status 11/22/2018 FINAL  Final  Culture, blood (routine x 2)     Status: None   Collection Time: 11/22/18  2:00 AM   Specimen: BLOOD  Result Value Ref Range Status   Specimen Description BLOOD SITE NOT SPECIFIED  Final   Special Requests   Final    BOTTLES DRAWN AEROBIC AND ANAEROBIC Blood Culture adequate volume   Culture   Final    NO GROWTH 5 DAYS Performed at [facility], [address], [place], [place] [address]    Report Status 11/27/2018 FINAL  Final  Culture, blood (routine x 2)     Status: None   Collection Time: 11/22/18  2:15 AM   Specimen: BLOOD  Result Value Ref Range Status   Specimen Description BLOOD SITE NOT SPECIFIED  Final   Special Requests   Final    BOTTLES DRAWN AEROBIC AND ANAEROBIC Blood Culture adequate volume   Culture   Final    NO GROWTH 5 DAYS Performed at [facility], [address], [place], [place] [address]    Report Status 11/27/2018 FINAL  Final  CSF culture     Status: None   Collection Time: 11/23/18   3:57 PM   Specimen: PATH Cytology CSF; Cerebrospinal Fluid  Result Value Ref Range Status   Specimen Description CSF  Final   Special Requests NONE  Final   Gram Stain NO WBC SEEN NO ORGANISMS SEEN CYTOSPIN SMEAR   Final   Culture   Final    NO GROWTH 3 DAYS Performed at [facility], [address] [place], [place] [address]    Report Status 11/27/2018 FINAL  Final  Culture, Urine     Status: Abnormal   Collection Time: 11/24/18 12:12 PM   Specimen: Urine, Catheterized  Result Value Ref Range Status   Specimen Description URINE, CATHETERIZED  Final   Special Requests NONE  Final   Culture (A)  Final    <10,000 COLONIES/mL INSIGNIFICANT GROWTH Performed at [facility], [address] [place], [place] [address]    Report Status 11/25/2018 FINAL  Final     Labs: BNP (last 3 results) No results for input(s): BNP in the last 8760 hours. Basic Metabolic Panel: Recent Labs  Lab 11/23/18 1033 11/24/18 0718 11/26/18 0955 11/29/18 0450  NA 143 138 137 139  K 3.0* 3.4* 3.7 3.9  CL 107 104 101 102  CO2 26 25 24 26   GLUCOSE 117* 173* 222* 182*  BUN 6* 6* 7* 7*  CREATININE 0.50 0.57 0.54 0.59  CALCIUM 8.6* 8.5* 9.1 8.7*   Liver Function Tests: Recent Labs  Lab 11/23/18 1033  AST 38  ALT 26  ALKPHOS 96  BILITOT 0.7  PROT 6.0*  ALBUMIN 2.7*   No results for input(s): LIPASE, AMYLASE in the last 168 hours. Recent Labs  Lab 11/27/18 0949  AMMONIA 20   CBC: Recent Labs  Lab 11/24/18 0718 11/26/18 0955 11/29/18 0450  WBC 5.9 8.8 6.8  HGB 12.7 14.3 12.8  HCT 37.8 41.7 38.9  MCV 88.7 86.2 88.8  PLT 164 209 247   Cardiac Enzymes: No results for input(s): CKTOTAL, CKMB, CKMBINDEX, TROPONINI in the last 168 hours. BNP: Invalid input(s): POCBNP CBG: Recent Labs  Lab 11/28/18 2038 11/28/18 2348 11/29/18 0318 11/29/18 0820 11/29/18 1122  GLUCAP 222* 145* 143* 180* 197*   D-Dimer No results for input(s): DDIMER in the last 72 hours. Hgb  A1c No results for input(s): HGBA1C in the last 72 hours. Lipid Profile No results for input(s): CHOL, HDL, LDLCALC, TRIG, CHOLHDL, LDLDIRECT in the last 72 hours.  Thyroid function studies No results for input(s): TSH, T4TOTAL, T3FREE, THYROIDAB in the last 72 hours.  Invalid input(s): FREET3 Anemia work up No results for input(s): VITAMINB12, FOLATE, FERRITIN, TIBC, IRON, RETICCTPCT in the last 72 hours. Urinalysis    Component Value Date/Time   COLORURINE YELLOW 11/22/2018 0107   APPEARANCEUR CLEAR 11/22/2018 0107   LABSPEC >1.046 (H) 11/22/2018 0107   PHURINE 5.0 11/22/2018 0107   GLUCOSEU NEGATIVE 11/22/2018 0107   HGBUR SMALL (A) 11/22/2018 0107   BILIRUBINUR NEGATIVE 11/22/2018 0107   KETONESUR 20 (A) 11/22/2018 0107   PROTEINUR 30 (A) 11/22/2018 0107   NITRITE NEGATIVE 11/22/2018 0107   LEUKOCYTESUR NEGATIVE 11/22/2018 0107   Sepsis Labs Invalid input(s): PROCALCITONIN,  WBC,  LACTICIDVEN Microbiology Recent Results (from the past 240 hour(s))  SARS Coronavirus 2 [facility](Hospital order, Performed in [facility] hospital lab) Nasopharyngeal Nasopharyngeal Swab     Status: None   Collection Time: 11/21/18  6:40 PM   Specimen: Nasopharyngeal Swab  Result Value Ref Range Status   SARS Coronavirus 2 NEGATIVE NEGATIVE Final    Comment: (NOTE) If result is NEGATIVE SARS-CoV-2 target nucleic acids are NOT DETECTED. The SARS-CoV-2 RNA is generally detectable in upper and lower  respiratory specimens during the acute phase of infection. The lowest  concentration of SARS-CoV-2 viral copies this assay can detect is 250  copies / mL. A negative result does not preclude SARS-CoV-2 infection  and should not be used as the sole basis for treatment or other  patient management decisions.  A negative result may occur with  improper specimen collection / handling, submission of specimen other  than nasopharyngeal swab, presence of viral mutation(s) within the  areas targeted by this assay,  and inadequate number of viral copies  (<250 copies / mL). A negative result must be combined with clinical  observations, patient history, and epidemiological information. If result is POSITIVE SARS-CoV-2 target nucleic acids are DETECTED. The SARS-CoV-2 RNA is generally detectable in upper and lower  respiratory specimens dur ing the acute phase of infection.  Positive  results are indicative of active infection with SARS-CoV-2.  Clinical  correlation with patient history and other diagnostic information is  necessary to determine patient infection status.  Positive results do  not rule out bacterial infection or co-infection with other viruses. If result is PRESUMPTIVE POSTIVE SARS-CoV-2 nucleic acids MAY BE PRESENT.   A presumptive positive result was obtained on the submitted specimen  and confirmed on repeat testing.  While 2019 novel coronavirus  (SARS-CoV-2) nucleic acids may be present in the submitted sample  additional confirmatory testing may be necessary for epidemiological  and / or clinical management purposes  to differentiate between  SARS-CoV-2 and other Sarbecovirus currently known to infect humans.  If clinically indicated additional testing with an alternate test  methodology [phone]([id number]) is advised. The SARS-CoV-2 RNA is generally  detectable in upper and lower respiratory sp ecimens during the acute  phase of infection. The expected result is Negative. Fact Sheet for Patients:  BoilerBrush.com.cyhttps://www.fda.gov/media/136312/download Fact Sheet for Healthcare Providers: https://pope.com/https://www.fda.gov/media/136313/download This test is not yet approved or cleared by the [place] FDA and has been authorized for detection and/or diagnosis of SARS-CoV-2 by FDA under an Emergency Use Authorization (EUA).  This EUA will remain in effect (meaning this test can be used) for the duration of the COVID-19 declaration under Section 564(b)(1) of the Act, 21 U.S.C. section 360bbb-3(b)(1), unless  the authorization is terminated or revoked sooner. Performed at [facility],  [address], [place], [place] [address]   Culture, Urine     Status: Abnormal   Collection Time: 11/22/18  1:16 AM   Specimen: Urine, Random  Result Value Ref Range Status   Specimen Description URINE, RANDOM  Final   Special Requests   Final    NONE Performed at [facility], [address] [place], [place] [address]    Culture MULTIPLE SPECIES PRESENT, SUGGEST RECOLLECTION (A)  Final   Report Status 11/22/2018 FINAL  Final  Culture, blood (routine x 2)     Status: None   Collection Time: 11/22/18  2:00 AM   Specimen: BLOOD  Result Value Ref Range Status   Specimen Description BLOOD SITE NOT SPECIFIED  Final   Special Requests   Final    BOTTLES DRAWN AEROBIC AND ANAEROBIC Blood Culture adequate volume   Culture   Final    NO GROWTH 5 DAYS Performed at [facility], [address] [place], [place] [address]    Report Status 11/27/2018 FINAL  Final  Culture, blood (routine x 2)     Status: None   Collection Time: 11/22/18  2:15 AM   Specimen: BLOOD  Result Value Ref Range Status   Specimen Description BLOOD SITE NOT SPECIFIED  Final   Special Requests   Final    BOTTLES DRAWN AEROBIC AND ANAEROBIC Blood Culture adequate volume   Culture   Final    NO GROWTH 5 DAYS Performed at [facility], [address], [place], [place] [address]    Report Status 11/27/2018 FINAL  Final  CSF culture     Status: None   Collection Time: 11/23/18  3:57 PM   Specimen: PATH Cytology CSF; Cerebrospinal Fluid  Result Value Ref Range Status   Specimen Description CSF  Final   Special Requests NONE  Final   Gram Stain NO WBC SEEN NO ORGANISMS SEEN CYTOSPIN SMEAR   Final   Culture   Final    NO GROWTH 3 DAYS Performed at [facility], [address], [place], [place] [address]    Report Status 11/27/2018 FINAL  Final  Culture, Urine     Status: Abnormal   Collection Time:  11/24/18 12:12 PM   Specimen: Urine, Catheterized  Result Value Ref Range Status   Specimen Description URINE, CATHETERIZED  Final   Special Requests NONE  Final   Culture (A)  Final    <10,000 COLONIES/mL INSIGNIFICANT GROWTH Performed at [facility], [address], [place], [place] [address]    Report Status 11/25/2018 FINAL  Final     Time coordinating discharge: 35 minutes      SIGNED:   Alberteen Sam, MD  [facility] 11/29/2018, 3:46 PM

## 2020-07-17 IMAGING — RF DG SPINAL PUNCT LUMBAR DIAG WITH FL CT GUIDANCE
1 series · 1 of 1 positions shown · non-contrast
Comparison: none

CLINICAL DATA: Inpatient. Altered mental status. Concern for CNS
infection.

[Series 1: cp_standard · 0.17mm/px · 1 of 1 slices shown]
[im 1/1]
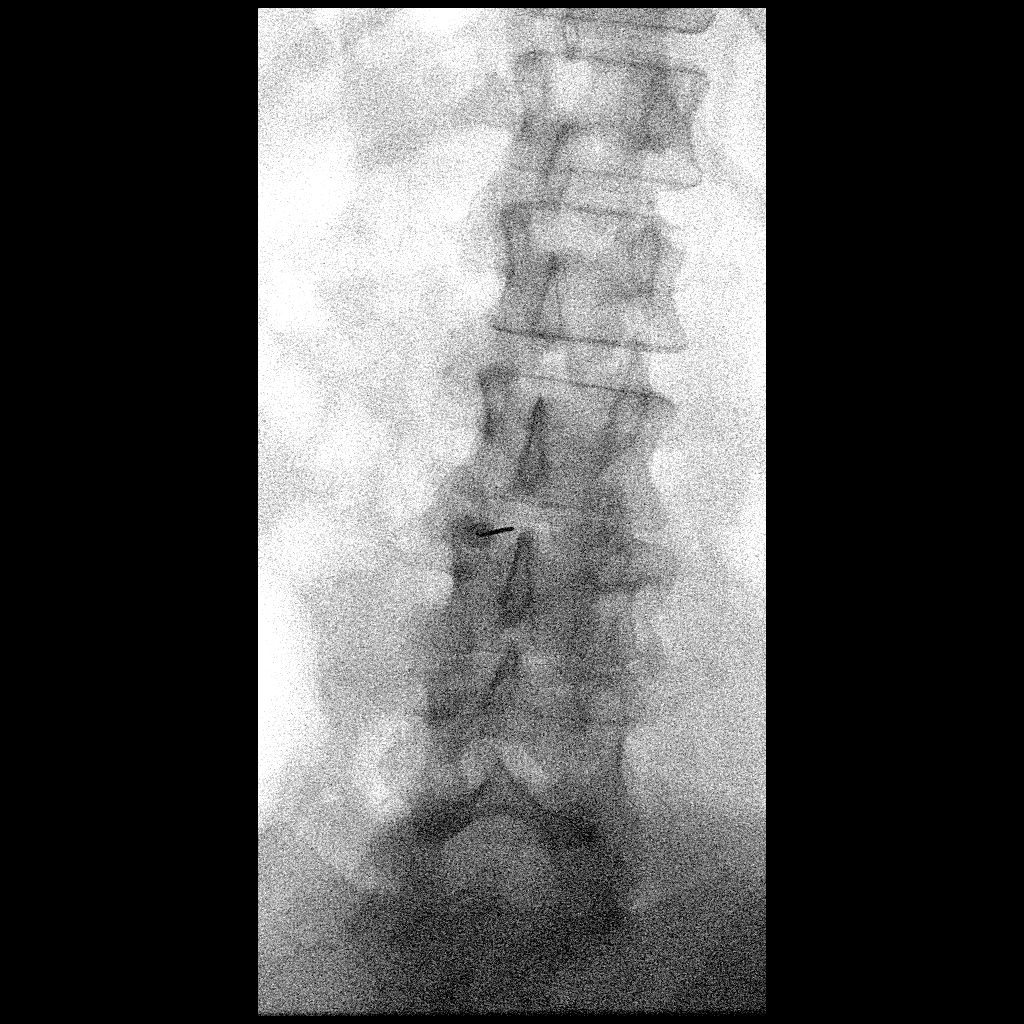

[1 of 1 positions shown; findings below may reference images not displayed]

EXAM:
DIAGNOSTIC LUMBAR PUNCTURE UNDER FLUOROSCOPIC GUIDANCE

FLUOROSCOPY TIME:  Fluoroscopy Time:  0 minutes 48 seconds

Radiation Exposure Index (if provided by the fluoroscopic device):
17.8 mGy

Number of Acquired Spot Images: 0

PROCEDURE:
Informed consent was obtained from the patient's fiance prior to the
procedure, including potential complications of headache, allergy,
and pain. With the patient prone, the lower back was prepped with
Betadine. 1% Lidocaine was used for local anesthesia. Lumbar
puncture was performed at the L2-3 level using a 20 gauge needle
with return of clear CSF. Opening pressure could not be obtained due
to very slow CSF flow. 8 ml of CSF were obtained for laboratory
studies. The patient tolerated the procedure well and there were no
apparent complications.
IMPRESSION: Technically successful diagnostic lumbar puncture under fluoroscopic
guidance.

## 2020-08-02 ENCOUNTER — Inpatient Hospital Stay (HOSPITAL_COMMUNITY)
Admission: AD | Admit: 2020-08-02 | Discharge: 2020-08-06 | DRG: 885 | Disposition: A | Discharge status: Home / Self Care | Payer: Medicaid Other | Source: Other Acute Inpatient Hospital | Attending: Psychiatry | Admitting: Psychiatry

## 2020-08-02 DIAGNOSIS — Z794 Long term (current) use of insulin: Secondary | ICD-10-CM | POA: Diagnosis not present

## 2020-08-02 DIAGNOSIS — F332 Major depressive disorder, recurrent severe without psychotic features: Principal | ICD-10-CM | POA: Diagnosis present

## 2020-08-02 DIAGNOSIS — Z79899 Other long term (current) drug therapy: Secondary | ICD-10-CM

## 2020-08-02 DIAGNOSIS — Z7982 Long term (current) use of aspirin: Secondary | ICD-10-CM | POA: Diagnosis not present

## 2020-08-02 DIAGNOSIS — Z7951 Long term (current) use of inhaled steroids: Secondary | ICD-10-CM

## 2020-08-02 DIAGNOSIS — E119 Type 2 diabetes mellitus without complications: Secondary | ICD-10-CM | POA: Diagnosis present

## 2020-08-02 DIAGNOSIS — Z818 Family history of other mental and behavioral disorders: Secondary | ICD-10-CM | POA: Diagnosis not present

## 2020-08-02 DIAGNOSIS — Z888 Allergy status to other drugs, medicaments and biological substances status: Secondary | ICD-10-CM

## 2020-08-02 DIAGNOSIS — K219 Gastro-esophageal reflux disease without esophagitis: Secondary | ICD-10-CM | POA: Diagnosis present

## 2020-08-02 DIAGNOSIS — E785 Hyperlipidemia, unspecified: Secondary | ICD-10-CM | POA: Diagnosis present

## 2020-08-02 DIAGNOSIS — E039 Hypothyroidism, unspecified: Secondary | ICD-10-CM | POA: Diagnosis present

## 2020-08-02 DIAGNOSIS — G4700 Insomnia, unspecified: Secondary | ICD-10-CM | POA: Diagnosis present

## 2020-08-02 DIAGNOSIS — D509 Iron deficiency anemia, unspecified: Secondary | ICD-10-CM | POA: Diagnosis present

## 2020-08-02 DIAGNOSIS — F17210 Nicotine dependence, cigarettes, uncomplicated: Secondary | ICD-10-CM | POA: Diagnosis present

## 2020-08-02 DIAGNOSIS — F1020 Alcohol dependence, uncomplicated: Secondary | ICD-10-CM | POA: Diagnosis present

## 2020-08-02 DIAGNOSIS — Z95810 Presence of automatic (implantable) cardiac defibrillator: Secondary | ICD-10-CM | POA: Diagnosis not present

## 2020-08-02 DIAGNOSIS — Z91041 Radiographic dye allergy status: Secondary | ICD-10-CM

## 2020-08-02 DIAGNOSIS — F419 Anxiety disorder, unspecified: Secondary | ICD-10-CM | POA: Diagnosis present

## 2020-08-02 DIAGNOSIS — R45851 Suicidal ideations: Secondary | ICD-10-CM | POA: Diagnosis present

## 2020-08-02 DIAGNOSIS — Z79890 Hormone replacement therapy (postmenopausal): Secondary | ICD-10-CM

## 2020-08-02 DIAGNOSIS — Z885 Allergy status to narcotic agent status: Secondary | ICD-10-CM

## 2020-08-02 DIAGNOSIS — Z7984 Long term (current) use of oral hypoglycemic drugs: Secondary | ICD-10-CM | POA: Diagnosis not present

## 2020-08-02 DIAGNOSIS — Z801 Family history of malignant neoplasm of trachea, bronchus and lung: Secondary | ICD-10-CM | POA: Diagnosis not present

## 2020-08-02 DIAGNOSIS — J449 Chronic obstructive pulmonary disease, unspecified: Secondary | ICD-10-CM | POA: Diagnosis present

## 2020-08-02 DIAGNOSIS — Z808 Family history of malignant neoplasm of other organs or systems: Secondary | ICD-10-CM

## 2020-08-02 HISTORY — DX: Presence of automatic (implantable) cardiac defibrillator: Z95.810

## 2020-08-02 HISTORY — DX: Heart failure, unspecified: I50.9

## 2020-08-02 HISTORY — DX: Chronic obstructive pulmonary disease, unspecified: J44.9

## 2020-08-02 LAB — GLUCOSE, CAPILLARY: Glucose-Capillary: 173 mg/dL — ABNORMAL HIGH (ref 70–99)

## 2020-08-02 MED ORDER — LORAZEPAM 1 MG PO TABS: 1.0000 mg | ORAL_TABLET | Freq: Four times a day (QID) | ORAL | Status: AC | PRN

## 2020-08-02 MED ORDER — LOPERAMIDE HCL 2 MG PO CAPS: 2.0000 mg | ORAL_CAPSULE | ORAL | Status: AC | PRN

## 2020-08-02 MED ORDER — ALUM & MAG HYDROXIDE-SIMETH 200-200-20 MG/5ML PO SUSP: 30.0000 mL | ORAL | Status: DC | PRN

## 2020-08-02 MED ORDER — THIAMINE HCL 100 MG/ML IJ SOLN
100.0000 mg | Freq: Once | INTRAMUSCULAR | Status: AC
  Administered 2020-08-02: 100 mg via INTRAMUSCULAR
  Filled 2020-08-02: qty 2

## 2020-08-02 MED ORDER — HYDROXYZINE HCL 25 MG PO TABS
25.0000 mg | ORAL_TABLET | Freq: Four times a day (QID) | ORAL | Status: AC | PRN
  Administered 2020-08-02 – 2020-08-05 (×3): 25 mg via ORAL
  Filled 2020-08-02 (×3): qty 1

## 2020-08-02 MED ORDER — ONDANSETRON 4 MG PO TBDP: 4.0000 mg | ORAL_TABLET | Freq: Four times a day (QID) | ORAL | Status: AC | PRN

## 2020-08-02 MED ORDER — THIAMINE HCL 100 MG PO TABS
100.0000 mg | ORAL_TABLET | Freq: Every day | ORAL | Status: DC
  Administered 2020-08-03 – 2020-08-06 (×4): 100 mg via ORAL
  Filled 2020-08-02 (×7): qty 1

## 2020-08-02 MED ORDER — ACETAMINOPHEN 325 MG PO TABS: 650.0000 mg | ORAL_TABLET | Freq: Four times a day (QID) | ORAL | Status: DC | PRN

## 2020-08-02 MED ORDER — MAGNESIUM HYDROXIDE 400 MG/5ML PO SUSP: 30.0000 mL | Freq: Every day | ORAL | Status: DC | PRN

## 2020-08-02 MED ORDER — TRAZODONE HCL 50 MG PO TABS
50.0000 mg | ORAL_TABLET | Freq: Every evening | ORAL | Status: DC | PRN
  Administered 2020-08-02 – 2020-08-05 (×4): 50 mg via ORAL
  Filled 2020-08-02 (×4): qty 1
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
## 2020-08-03 ENCOUNTER — Encounter (HOSPITAL_COMMUNITY): Payer: Self-pay | Admitting: Urology

## 2020-08-03 ENCOUNTER — Other Ambulatory Visit: Payer: Self-pay

## 2020-08-03 DIAGNOSIS — F332 Major depressive disorder, recurrent severe without psychotic features: Principal | ICD-10-CM

## 2020-08-03 LAB — TSH: TSH: 19.177 u[IU]/mL — ABNORMAL HIGH (ref 0.350–4.500)

## 2020-08-03 LAB — URINALYSIS, COMPLETE (UACMP) WITH MICROSCOPIC
Bacteria, UA: NONE SEEN
Bilirubin Urine: NEGATIVE
Glucose, UA: NEGATIVE mg/dL
Hgb urine dipstick: NEGATIVE
Ketones, ur: NEGATIVE mg/dL
Leukocytes,Ua: NEGATIVE
Nitrite: NEGATIVE
Protein, ur: NEGATIVE mg/dL
Specific Gravity, Urine: 1.003 — ABNORMAL LOW (ref 1.005–1.030)
pH: 6 (ref 5.0–8.0)

## 2020-08-03 LAB — BRAIN NATRIURETIC PEPTIDE: B Natriuretic Peptide: 46.7 pg/mL (ref 0.0–100.0)

## 2020-08-03 LAB — GLUCOSE, CAPILLARY
Glucose-Capillary: 135 mg/dL — ABNORMAL HIGH (ref 70–99)
Glucose-Capillary: 222 mg/dL — ABNORMAL HIGH (ref 70–99)

## 2020-08-03 MED ORDER — INSULIN DEGLUDEC 100 UNIT/ML ~~LOC~~ SOPN
15.0000 [IU] | PEN_INJECTOR | Freq: Every day | SUBCUTANEOUS | Status: DC
  Filled 2020-08-03: qty 3

## 2020-08-03 MED ORDER — INSULIN ASPART 100 UNIT/ML IJ SOLN
0.0000 [IU] | Freq: Three times a day (TID) | INTRAMUSCULAR | Status: DC
  Administered 2020-08-03: 2 [IU] via SUBCUTANEOUS
  Administered 2020-08-04: 3 [IU] via SUBCUTANEOUS
  Administered 2020-08-05 – 2020-08-06 (×3): 2 [IU] via SUBCUTANEOUS

## 2020-08-03 MED ORDER — NICOTINE 14 MG/24HR TD PT24
14.0000 mg | MEDICATED_PATCH | Freq: Every day | TRANSDERMAL | Status: DC
  Administered 2020-08-03 – 2020-08-06 (×4): 14 mg via TRANSDERMAL
  Filled 2020-08-03 (×7): qty 1

## 2020-08-03 MED ORDER — FERROUS SULFATE 325 (65 FE) MG PO TABS
325.0000 mg | ORAL_TABLET | Freq: Two times a day (BID) | ORAL | Status: DC
  Administered 2020-08-03 – 2020-08-06 (×5): 325 mg via ORAL
  Filled 2020-08-03 (×11): qty 1

## 2020-08-03 MED ORDER — METFORMIN HCL 500 MG PO TABS
1000.0000 mg | ORAL_TABLET | Freq: Two times a day (BID) | ORAL | Status: DC
  Administered 2020-08-03 – 2020-08-06 (×6): 1000 mg via ORAL
  Filled 2020-08-03 (×10): qty 2

## 2020-08-03 MED ORDER — FLUTICASONE PROPIONATE HFA 44 MCG/ACT IN AERO
2.0000 | INHALATION_SPRAY | Freq: Two times a day (BID) | RESPIRATORY_TRACT | Status: DC
  Administered 2020-08-03 – 2020-08-06 (×7): 2 via RESPIRATORY_TRACT
  Filled 2020-08-03: qty 10.6

## 2020-08-03 MED ORDER — SPIRONOLACTONE 25 MG PO TABS
25.0000 mg | ORAL_TABLET | Freq: Every day | ORAL | Status: DC
  Administered 2020-08-03: 25 mg via ORAL
  Filled 2020-08-03 (×3): qty 1

## 2020-08-03 MED ORDER — INSULIN GLARGINE 100 UNIT/ML ~~LOC~~ SOLN
15.0000 [IU] | Freq: Every day | SUBCUTANEOUS | Status: DC
  Administered 2020-08-03 – 2020-08-05 (×3): 15 [IU] via SUBCUTANEOUS

## 2020-08-03 MED ORDER — LEVOTHYROXINE SODIUM 150 MCG PO TABS
150.0000 ug | ORAL_TABLET | Freq: Every day | ORAL | Status: DC
  Administered 2020-08-03 – 2020-08-06 (×4): 150 ug via ORAL
  Filled 2020-08-03 (×6): qty 1

## 2020-08-03 MED ORDER — DAPAGLIFLOZIN PROPANEDIOL 5 MG PO TABS
5.0000 mg | ORAL_TABLET | Freq: Every day | ORAL | Status: DC
  Administered 2020-08-03 – 2020-08-06 (×4): 5 mg via ORAL
  Filled 2020-08-03 (×6): qty 1

## 2020-08-03 MED ORDER — PANTOPRAZOLE SODIUM 40 MG PO TBEC
40.0000 mg | DELAYED_RELEASE_TABLET | Freq: Every day | ORAL | Status: DC
  Administered 2020-08-03 – 2020-08-06 (×4): 40 mg via ORAL
  Filled 2020-08-03 (×7): qty 1

## 2020-08-03 MED ORDER — EPLERENONE 25 MG PO TABS
25.0000 mg | ORAL_TABLET | Freq: Every day | ORAL | Status: DC
  Administered 2020-08-04 – 2020-08-06 (×3): 25 mg via ORAL
  Filled 2020-08-03 (×5): qty 1

## 2020-08-03 MED ORDER — SERTRALINE HCL 25 MG PO TABS
25.0000 mg | ORAL_TABLET | Freq: Every day | ORAL | Status: DC
  Administered 2020-08-03 – 2020-08-06 (×4): 25 mg via ORAL
  Filled 2020-08-03 (×7): qty 1

## 2020-08-03 MED ORDER — ROSUVASTATIN CALCIUM 40 MG PO TABS
40.0000 mg | ORAL_TABLET | Freq: Every day | ORAL | Status: DC
  Administered 2020-08-03 – 2020-08-06 (×4): 40 mg via ORAL
  Filled 2020-08-03 (×7): qty 1

## 2020-08-03 NOTE — Progress Notes (Signed)
Adult Psychoeducational Group Note  Date:  08/03/2020 Time:  4:48 PM  Group Topic/Focus:  Healthy Communication:   The focus of this group is to discuss communication, barriers to communication, as well as healthy ways to communicate with others.  Participation Level:  Active  Participation Quality:  Appropriate and Attentive  Affect:  Appropriate  Cognitive:  Alert and Appropriate  Insight: Appropriate and Good  Engagement in Group:  Engaged  Modes of Intervention:  Discussion  Additional Comments:  Pt attended and participated in the second group of the day on healthy communication.   Deforest Hoyles Scotty Weigelt 08/03/2020, 4:48 PM

## 2020-08-03 NOTE — Progress Notes (Signed)
Adult Psychoeducational Group Note  Date:  08/03/2020 Time:  2:39 PM  Group Topic/Focus:  Goals Group:   The focus of this group is to help patients establish daily goals to achieve during treatment and discuss how the patient can incorporate goal setting into their daily lives to aide in recovery.  Participation Level:  Active  Participation Quality:  Appropriate and Attentive  Affect:  Appropriate  Cognitive:  Alert and Appropriate  Insight: Appropriate and Good  Engagement in Group:  Engaged  Modes of Intervention:  Discussion  Additional Comments:  Pt has a goal of being positive and thinking about all of the people who love and care for her.   Deforest Hoyles Fernande Treiber 08/03/2020, 2:39 PM

## 2020-08-03 NOTE — Tx Team (Signed)
Initial Treatment Plan 08/03/2020 3:26 AM Beatris Ship [id number]    PATIENT STRESSORS: Loss of friend Medication change or noncompliance   PATIENT STRENGTHS: General fund of knowledge Motivation for treatment/growth   PATIENT IDENTIFIED PROBLEMS: Risk for suicide  depression  Grief  "I don't know, grief counseling"               DISCHARGE CRITERIA:  Improved stabilization in mood, thinking, and/or behavior Verbal commitment to aftercare and medication compliance  PRELIMINARY DISCHARGE PLAN: Attend aftercare/continuing care group Attend PHP/IOP Outpatient therapy  PATIENT/FAMILY INVOLVEMENT: This treatment plan has been presented to and reviewed with the patient, Mitsy Owen.  The patient and family have been given the opportunity to ask questions and make suggestions.  Delos Haring, RN 08/03/2020, 3:26 AM

## 2020-08-03 NOTE — Progress Notes (Signed)
   08/03/20 1500  Psych Admission Type (Psych Patients Only)  Admission Status Voluntary  Psychosocial Assessment  Patient Complaints Depression;Worrying  Eye Contact Fair  Facial Expression Anxious  Affect Sad  Speech Logical/coherent  Interaction Assertive  Motor Activity Slow  Appearance/Hygiene Disheveled  Behavior Characteristics Cooperative  Mood Anxious  Thought Process  Coherency Circumstantial  Content WDL  Delusions WDL  Perception WDL  Hallucination None reported or observed  Judgment Poor  Confusion WDL  Danger to Self  Current suicidal ideation? Denies  Danger to Others  Danger to Others None reported or observed

## 2020-08-03 NOTE — Progress Notes (Signed)
Recreation Therapy Notes  Animal-Assisted Activity (AAA) Program Checklist/Progress Notes Patient Eligibility Criteria Checklist & Daily Group note for Rec Tx Intervention  Date: 6.14.22 Time: 1430 Location: [address] Dayroom   AAA/T Program Assumption of Risk Form signed by Patient/ or Parent Legal Guardian YES  Patient is free of allergies or severe asthma YES   Patient reports no fear of animals YES   Patient reports no history of cruelty to animals YES  Patient understands his/her participation is voluntary YES  Patient washes hands before animal contact YES  Patient washes hands after animal contact YES   Behavioral Response: Engaged  Education: Hand Washing, Appropriate Animal Interaction   Education Outcome: Acknowledges understanding/In group clarification offered/Needs additional education.   Clinical Observations/Feedback: Pt attended and participated in activity.      Bari Leib, LRT/CTRS        Chandni Gagan A 08/03/2020 3:27 PM 
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
## 2020-08-03 NOTE — Progress Notes (Signed)
Patient ID: Anna Rojas, female   DOB: April 13, 1955, 65 y.o.   MRN: [id number]  Admission Note:  65 yr female who presents VC in no acute distress for the treatment of SI and Depression. Pt appears flat and depressed. Pt was calm and cooperative with admission process. Pt denies SI/ HI/AVH at this time.   Per Assessment: Patient presented to the [facility] ED with depression and suicidal ideation.  Patient states that her sister died in Hospice 8 years ago and she could not bring herself to go there and watch her take her last breath.  Patient states that her good friend recently died in Hospice and she went to see her there and it triggered a lot of emotions for her.  She states that she had been sober for 2.5 years, but relapsed two weeks ago and states that she has been drinking 18 beers per day. Patient states, "I just got overwhelmed, I did not know what to do, I feels so guilty and depressed." Patient states that she feels like she could have and should have done more to help her friend prior to her dying.  Patient states that she has been having suicidal thoughts of overdosing on her Celexa that is prescribed to her by her PCP Bennie Pierini).  Patient states that she has never attempted suicide in the past.  Patient states that she just wants to get over this feeling.  She denis HI/Psychosis.  Patient states that she has no current withdrawal symptoms and states that she normally has no complications when coming off alcohol.  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 PT searched and no contraband found, POC and unit policies explained and understanding verbalized. Consents obtained. Food and fluids offered, and  accepted.   R:Pt had no additional questions or concerns.

## 2020-08-03 NOTE — BHH Suicide Risk Assessment (Signed)
[facility] Admission Suicide Risk Assessment   Nursing information obtained from:  Patient Demographic factors:  Unemployed Current Mental Status:  Self-harm thoughts Loss Factors:  Loss of significant relationship Historical Factors:  Prior suicide attempts Risk Reduction Factors:  Positive social support  Total Time spent with patient: 30 minutes Principal Problem: <principal problem not specified> Diagnosis:  Active Problems:   MDD (major depressive disorder), recurrent severe, without psychosis (HCC)  Subjective Data: Patient is seen and examined.  Patient is a 65 year old female with a past psychiatric history significant for alcohol dependence and depression who presented to the [facility] emergency department on 08/03/2020 with suicidal ideation.  The patient stated that her good friend had recently died in hospice, and then went to visit her, and triggered emotions from the death of her sister in the past.  The patient admitted that she had relapsed on alcohol and was drinking approximately 18 beers a day.  She admitted to helplessness, hopelessness, worthlessness and thoughts of suicide.  Continued Clinical Symptoms:  Alcohol Use Disorder Identification Test Final Score (AUDIT): 12 The "Alcohol Use Disorders Identification Test", Guidelines for Use in Primary Care, Second Edition.  World Science writer [facility]). Score between 0-7:  no or low risk or alcohol related problems. Score between 8-15:  moderate risk of alcohol related problems. Score between 16-19:  high risk of alcohol related problems. Score 20 or above:  warrants further diagnostic evaluation for alcohol dependence and treatment.   CLINICAL FACTORS:   Depression:   Anhedonia Comorbid alcohol abuse/dependence Hopelessness Impulsivity Insomnia Alcohol/Substance Abuse/Dependencies   Musculoskeletal: Strength & Muscle Tone: abnormal Gait & Station: shuffle Patient leans: N/A  Psychiatric Specialty  Exam:  Presentation  General Appearance: Disheveled  Eye Contact:Fair  Speech:Normal Rate  Speech Volume:Normal  Handedness:Right   Mood and Affect  Mood:Depressed  Affect:Congruent   Thought Process  Thought Processes:Goal Directed  Descriptions of Associations:Intact  Orientation:Full (Time, Place and Person)  Thought Content:Logical  History of Schizophrenia/Schizoaffective disorder:No  Duration of Psychotic Symptoms:No data recorded Hallucinations:Hallucinations: None  Ideas of Reference:None  Suicidal Thoughts:Suicidal Thoughts: Yes, Passive SI Passive Intent and/or Plan: Without Intent  Homicidal Thoughts:Homicidal Thoughts: No   Sensorium  Memory:Immediate Good; Recent Good; Remote Good  Judgment:Fair  Insight:Fair   Executive Functions  Concentration:Fair  Attention Span:Fair  Recall:Fair  Fund of Knowledge:Fair  Language:Fair   Psychomotor Activity  Psychomotor Activity:Psychomotor Activity: Normal   Assets  Assets:Desire for Improvement; Financial Resources/Insurance; Housing; Social Support   Sleep  Sleep:Sleep: Fair Number of Hours of Sleep: 5.5    Physical Exam: Physical Exam Vitals and nursing note reviewed.  HENT:     Head: Normocephalic and atraumatic.  Pulmonary:     Effort: Pulmonary effort is normal.  Neurological:     General: No focal deficit present.     Mental Status: She is alert and oriented to person, place, and time.   Review of Systems  Constitutional:  Positive for malaise/fatigue.  Respiratory:  Positive for shortness of breath.   Cardiovascular:  Positive for leg swelling.  Musculoskeletal:  Positive for myalgias.  Psychiatric/Behavioral:  Positive for depression, substance abuse and suicidal ideas. The patient is nervous/anxious and has insomnia.   Blood pressure 113/79, pulse 84, temperature (!) 97.5 F (36.4 C), temperature source Oral, resp. rate 20, height 5' 1.75" (1.568 m), weight 91.6  kg, SpO2 94 %. Body mass index is 37.25 kg/m.   COGNITIVE FEATURES THAT CONTRIBUTE TO RISK:  None    SUICIDE RISK:   Moderate:  Frequent suicidal ideation with limited intensity, and duration, some specificity in terms of plans, no associated intent, good self-control, limited dysphoria/symptomatology, some risk factors present, and identifiable protective factors, including available and accessible social support.  PLAN OF CARE: Patient is seen and examined.  Patient is a 65 year old female with the above-stated psychiatric history who was admitted secondary to depression, suicidal ideation and alcoholism.  She will be admitted to the hospital.  She will be integrated in the milieu.  She will be encouraged to attend groups.  She stated that she has been treated with 2 different antidepressants in the past including citalopram and sertraline.  We discussed the fact with her medical issues including congestive heart failure that Zoloft would probably be the safest medicine.  We will start that at 25 mg p.o. daily and titrate that during the course hospitalization.  She will also be placed on the lorazepam 1 mg p.o. every 6 hours as needed a CIWA greater than 10.  She will also be given folic acid as well as thiamine.  She has a past psychiatric history significant for anemia, diabetes, cirrhosis or fatty liver, asthma or COPD, chronic pain, hyperlipidemia, congestive heart failure and hypothyroidism.  Unfortunately none of those medications for the treatment of these illnesses were continued, and I will restart those medicines today.  Her laboratories from [facility] were reviewed, and had a CBC that was essentially normal.  Her blood sugar there was 130.  Urinalysis showed 1+ glucose, 1+ nitrate positive, 3+ urine white blood cells and 2+ urine bacteria.  We will repeat that urinalysis.  Drug screen was completely negative.  Respiratory panel for influenza A, B, respiratory syncytial virus and COVID  were all negative.  RPR was previously nonreactive.  Her AST was mildly elevated at 45.  Blood alcohol was 0.21.  Her vital signs are stable, she is afebrile.  Pulse oximetry on room air was 95%.  She slept 5.75 hours last night, her CIWA this a.m. was 1.  I certify that inpatient services furnished can reasonably be expected to improve the patient's condition.   Antonieta Pert, MD 08/03/2020, 11:42 AM

## 2020-08-03 NOTE — Progress Notes (Signed)
UA container given to pt.

## 2020-08-03 NOTE — H&P (Signed)
Psychiatric Admission Assessment Adult  Patient Identification: Anna Rojas MRN:  [id number] Date of Evaluation:  08/03/2020 Chief Complaint:  MDD (major depressive disorder), recurrent severe, without psychosis (HCC) [F33.2] Principal Diagnosis: <principal problem not specified> Diagnosis:  Active Problems:   MDD (major depressive disorder), recurrent severe, without psychosis (HCC)  Subjective: Patient is a 65 year old female admitted secondary to suicidal ideation.  History of Present Illness: Patient is seen and examined.  Patient is a 65 year old female with a past psychiatric history significant for alcohol dependence and depression who presented to the [facility] emergency department on 08/03/2020 with suicidal ideation.  The patient stated that her good friend had recently died in hospice, and then went to visit her, and triggered emotions from the death of her sister in the past.  The patient admitted that she had relapsed on alcohol and was drinking approximately 18 beers a day.  She admitted to helplessness, hopelessness, worthlessness and thoughts of suicide.  Associated Signs/Symptoms: Depression Symptoms:  depressed mood, anhedonia, insomnia, psychomotor retardation, fatigue, feelings of worthlessness/guilt, difficulty concentrating, hopelessness, suicidal thoughts with specific plan, anxiety, disturbed sleep, Duration of Depression Symptoms: Greater than two weeks  (Hypo) Manic Symptoms:  Impulsivity, Anxiety Symptoms:  Excessive Worry, Psychotic Symptoms:   Denied PTSD Symptoms: Secondary to death of sister in the past  Total Time spent with patient: 30 minutes  Past Psychiatric History: Patient stated that she never attempted suicide in the past.  She has a longstanding history of alcohol dependence.  She has had periods of time where she was sober for 2-1/2 years.  She stated she had been drinking approximately 18 beers a day recently.  This occurred after  her friend had died and she felt great guilt.  She has been treated with Celexa in the past.  Is the patient at risk to self? Yes.    Has the patient been a risk to self in the past 6 months? Yes.    Has the patient been a risk to self within the distant past? No.  Is the patient a risk to others? No.  Has the patient been a risk to others in the past 6 months? No.  Has the patient been a risk to others within the distant past? No.   Prior Inpatient Therapy:   Prior Outpatient Therapy:    Alcohol Screening: 1. How often do you have a drink containing alcohol?: Monthly or less 2. How many drinks containing alcohol do you have on a typical day when you are drinking?: 10 or more 3. How often do you have six or more drinks on one occasion?: Monthly AUDIT-C Score: 7 4. How often during the last year have you found that you were not able to stop drinking once you had started?: Less than monthly 5. How often during the last year have you failed to do what was normally expected from you because of drinking?: Less than monthly 6. How often during the last year have you needed a first drink in the morning to get yourself going after a heavy drinking session?: Never 7. How often during the last year have you had a feeling of guilt of remorse after drinking?: Less than monthly 8. How often during the last year have you been unable to remember what happened the night before because you had been drinking?: Never 9. Have you or someone else been injured as a result of your drinking?: No 10. Has a relative or friend or a doctor or another health worker been  concerned about your drinking or suggested you cut down?: Yes, but not in the last year Alcohol Use Disorder Identification Test Final Score (AUDIT): 12 Alcohol Brief Interventions/Follow-up: Alcohol education/Brief advice Substance Abuse History in the last 12 months:  Yes.   Consequences of Substance Abuse: Medical Consequences:    Basically the  reason for this admission Previous Psychotropic Medications: Yes  Psychological Evaluations: Yes  Past Medical History:  Past Medical History:  Diagnosis Date   AICD (automatic cardioverter/defibrillator) present    CHF (congestive heart failure) (HCC)    COPD (chronic obstructive pulmonary disease) (HCC)    Diabetes mellitus without complication (HCC)    Hypothyroidism     Past Surgical History:  Procedure Laterality Date   CARDIAC DEFIBRILLATOR PLACEMENT     PACEMAKER PLACEMENT     Family History:  Family History  Problem Relation Age of Onset   Brain cancer Mother    Lung cancer Sister    Family Psychiatric  History: Mother and father probably as well as depression. Tobacco Screening: Have you used any form of tobacco in the last 30 days? (Cigarettes, Smokeless Tobacco, Cigars, and/or Pipes): Yes Tobacco use, Select all that apply: 5 or more cigarettes per day Are you interested in Tobacco Cessation Medications?: Yes, will notify MD for an order Counseled patient on smoking cessation including recognizing danger situations, developing coping skills and basic information about quitting provided: Refused/Declined practical counseling Social History:  Social History   Substance and Sexual Activity  Alcohol Use Yes     Social History   Substance and Sexual Activity  Drug Use Not Currently    Additional Social History: Marital status: Long term relationship Long term relationship, how long?: 20 years What types of issues is patient dealing with in the relationship?: "My drinking" Are you sexually active?: Yes What is your sexual orientation?: Heterosexual Has your sexual activity been affected by drugs, alcohol, medication, or emotional stress?: No Does patient have children?: Yes How many children?: 1 How is patient's relationship with their children?: "I have a daughter who is 46 but she does not speak to me and that is her choice"    Pain Medications: Denies  abuse Prescriptions: Denies abuse Over the Counter: Denies abuse History of alcohol / drug use?: Yes Longest period of sobriety (when/how long): 2.5 years Negative Consequences of Use: Personal relationships Name of Substance 1: Alcohol 1 - Age of First Use: unknown 1 - Amount (size/oz): 18 beers 1 - Frequency: daily 1 - Duration: 2 weeks this episode 1 - Last Use / Amount: 08/01/2020 1 - Method of Aquiring: Store 1- Route of Use: Oral                  Allergies:   Allergies  Allergen Reactions   Beclomethasone Itching, Rash and Other (See Comments)    Flushing skin redness, also   Brimonidine Hives and Itching   Codeine Hives and Rash   Darvon [Propoxyphene] Hives   Iodinated Diagnostic Agents Hives   Iodine-131 Hives   Oxycodone Hives and Rash   Tranexamic Acid Itching and Rash   Ceftriaxone Sodium In Dextrose Rash    12/29/17 Developed diffuse pruritic rash on chest, face, hips, arms after finishing rocephin dose.  Patient did have rocephin in 2017 without a problem though...  11/22/2018 - unable to tolerate retrial of ceftriaxone, rash developed after dose.    Norethindrone Rash   Brimonidine Tartrate Hives and Itching   Celecoxib Itching and Rash   Oxycontin [  Oxycodone Hcl] Hives and Rash   Lab Results:  Results for orders placed or performed during the hospital encounter of 08/02/20 (from the past 48 hour(s))  Glucose, capillary     Status: Abnormal   Collection Time: 08/02/20 10:47 PM  Result Value Ref Range   Glucose-Capillary 173 (H) 70 - 99 mg/dL    Comment: Glucose reference range applies only to samples taken after fasting for at least 8 hours.    Blood Alcohol level:  Lab Results  Component Value Date   ETH <10 11/21/2018    Metabolic Disorder Labs:  Lab Results  Component Value Date   HGBA1C 11.1 (H) 11/22/2018   MPG 271.87 11/22/2018   MPG 269 11/21/2018   No results found for: PROLACTIN Lab Results  Component Value Date   CHOL 150  11/22/2018   TRIG 128 11/22/2018   HDL 39 (L) 11/22/2018   CHOLHDL 3.8 11/22/2018   VLDL 26 11/22/2018   LDLCALC 85 11/22/2018    Current Medications: Current Facility-Administered Medications  Medication Dose Route Frequency Provider Last Rate Last Admin   acetaminophen (TYLENOL) tablet 650 mg  650 mg Oral Q6H PRN Ajibola, Ene A, NP       alum & mag hydroxide-simeth (MAALOX/MYLANTA) 200-200-20 MG/5ML suspension 30 mL  30 mL Oral Q4H PRN Ajibola, Ene A, NP       dapagliflozin propanediol (FARXIGA) tablet 5 mg  5 mg Oral Daily Antonieta Pertlary, Gustava Berland Lawson, MD   5 mg at 08/03/20 1639   [START ON 08/04/2020] eplerenone (INSPRA) tablet 25 mg  25 mg Oral Daily Singleton, Amy E, MD       ferrous sulfate tablet 325 mg  325 mg Oral BID WC Antonieta Pertlary, Brandom Kerwin Lawson, MD   325 mg at 08/03/20 1644   fluticasone (FLOVENT HFA) 44 MCG/ACT inhaler 2 puff  2 puff Inhalation BID Antonieta Pertlary, Misa Fedorko Lawson, MD   2 puff at 08/03/20 1352   hydrOXYzine (ATARAX/VISTARIL) tablet 25 mg  25 mg Oral Q6H PRN Ajibola, Ene A, NP   25 mg at 08/03/20 0754   insulin aspart (novoLOG) injection 0-15 Units  0-15 Units Subcutaneous TID WC Antonieta Pertlary, Kiani Wurtzel Lawson, MD       insulin glargine (LANTUS) injection 15 Units  15 Units Subcutaneous QHS Mason JimSingleton, Amy E, MD       levothyroxine (SYNTHROID) tablet 150 mcg  150 mcg Oral Q0600 Antonieta Pertlary, Malaika Arnall Lawson, MD   150 mcg at 08/03/20 1352   loperamide (IMODIUM) capsule 2-4 mg  2-4 mg Oral PRN Ajibola, Ene A, NP       LORazepam (ATIVAN) tablet 1 mg  1 mg Oral Q6H PRN Ajibola, Ene A, NP       magnesium hydroxide (MILK OF MAGNESIA) suspension 30 mL  30 mL Oral Daily PRN Ajibola, Ene A, NP       metFORMIN (GLUCOPHAGE) tablet 1,000 mg  1,000 mg Oral BID WC Antonieta Pertlary, Syann Cupples Lawson, MD   1,000 mg at 08/03/20 1641   nicotine (NICODERM CQ - dosed in mg/24 hours) patch 14 mg  14 mg Transdermal Daily Ajibola, Ene A, NP   14 mg at 08/03/20 0751   ondansetron (ZOFRAN-ODT) disintegrating tablet 4 mg  4 mg Oral Q6H PRN Ajibola, Ene A, NP        pantoprazole (PROTONIX) EC tablet 40 mg  40 mg Oral Daily Antonieta Pertlary, Myiesha Edgar Lawson, MD   40 mg at 08/03/20 1349   rosuvastatin (CRESTOR) tablet 40 mg  40 mg Oral Daily Antonieta Pertlary, Rachael Zapanta Lawson,  MD   40 mg at 08/03/20 1638   sertraline (ZOLOFT) tablet 25 mg  25 mg Oral Daily Antonieta Pert, MD   25 mg at 08/03/20 1100   thiamine tablet 100 mg  100 mg Oral Daily Ajibola, Ene A, NP   100 mg at 08/03/20 0751   traZODone (DESYREL) tablet 50 mg  50 mg Oral QHS PRN Ajibola, Ene A, NP   50 mg at 08/02/20 2319   PTA Medications: Medications Prior to Admission  Medication Sig Dispense Refill Last Dose   diclofenac Sodium (VOLTAREN) 1 % GEL Place 1 application onto the skin 4 (four) times daily as needed.      lactulose (CHRONULAC) 10 GM/15ML solution Take 20 g by mouth 2 (two) times daily.      aspirin EC 81 MG tablet Take 81 mg by mouth daily.      citalopram (CELEXA) 20 MG tablet Take 20 mg by mouth daily.      eplerenone (INSPRA) 25 MG tablet Take 25 mg by mouth daily.      FARXIGA 5 MG TABS tablet Take 5 mg by mouth daily.      metFORMIN (GLUCOPHAGE) 500 MG tablet Take 500 mg by mouth 2 (two) times daily with a meal.      montelukast (SINGULAIR) 10 MG tablet Take 1 tablet by mouth daily.      omeprazole (PRILOSEC) 40 MG capsule Take 40 mg by mouth daily.      rosuvastatin (CRESTOR) 20 MG tablet Take 20 mg by mouth daily.      spironolactone (ALDACTONE) 25 MG tablet Take 1 tablet by mouth daily.      SYMBICORT 160-4.5 MCG/ACT inhaler Inhale 2 puffs into the lungs 2 (two) times daily.      SYNTHROID 150 MCG tablet Take 150 mcg by mouth daily.      TRESIBA FLEXTOUCH 100 UNIT/ML SOPN FlexTouch Pen Inject 16 Units into the skin at bedtime.       Musculoskeletal: Strength & Muscle Tone: within normal limits Gait & Station: normal Patient leans: N/A            Psychiatric Specialty Exam:  Presentation  General Appearance: Disheveled  Eye Contact:Fair  Speech:Normal Rate  Speech  Volume:Normal  Handedness:Right   Mood and Affect  Mood:Depressed  Affect:Congruent   Thought Process  Thought Processes:Goal Directed  Duration of Psychotic Symptoms: No data recorded Past Diagnosis of Schizophrenia or Psychoactive disorder: No  Descriptions of Associations:Intact  Orientation:Full (Time, Place and Person)  Thought Content:Logical  Hallucinations:Hallucinations: None  Ideas of Reference:None  Suicidal Thoughts:Suicidal Thoughts: Yes, Passive SI Passive Intent and/or Plan: Without Intent  Homicidal Thoughts:Homicidal Thoughts: No   Sensorium  Memory:Immediate Good; Recent Good; Remote Good  Judgment:Fair  Insight:Fair   Executive Functions  Concentration:Fair  Attention Span:Fair  Recall:Fair  Fund of Knowledge:Fair  Language:Fair   Psychomotor Activity  Psychomotor Activity:Psychomotor Activity: Normal   Assets  Assets:Desire for Improvement; Financial Resources/Insurance; Housing; Social Support   Sleep  Sleep:Sleep: Fair Number of Hours of Sleep: 5.5    Physical Exam: Physical Exam Vitals and nursing note reviewed.  Constitutional:      Appearance: Normal appearance. She is obese.  HENT:     Head: Normocephalic and atraumatic.  Pulmonary:     Effort: Pulmonary effort is normal.  Neurological:     General: No focal deficit present.     Mental Status: She is alert and oriented to person, place, and time.   Review of  Systems  Constitutional:  Positive for malaise/fatigue.  Respiratory:  Positive for shortness of breath.   Cardiovascular:  Positive for leg swelling.  Psychiatric/Behavioral:  Positive for substance abuse and suicidal ideas. Negative for hallucinations.   Blood pressure 113/79, pulse 84, temperature (!) 97.5 F (36.4 C), temperature source Oral, resp. rate 20, height 5' 1.75" (1.568 m), weight 91.6 kg, SpO2 94 %. Body mass index is 37.25 kg/m.  Treatment Plan Summary: Patient is seen and examined.   Patient is a 65 year old female with the above-stated psychiatric history who was admitted secondary to depression, suicidal ideation and alcoholism.  She will be admitted to the hospital.  She will be integrated in the milieu.  She will be encouraged to attend groups.  She stated that she has been treated with 2 different antidepressants in the past including citalopram and sertraline.  We discussed the fact with her medical issues including congestive heart failure that Zoloft would probably be the safest medicine.  We will start that at 25 mg p.o. daily and titrate that during the course hospitalization.  She will also be placed on the lorazepam 1 mg p.o. every 6 hours as needed a CIWA greater than 10.  She will also be given folic acid as well as thiamine.  She has a past psychiatric history significant for anemia, diabetes, cirrhosis or fatty liver, asthma or COPD, chronic pain, hyperlipidemia, congestive heart failure and hypothyroidism.  Unfortunately none of those medications for the treatment of these illnesses were continued, and I will restart those medicines today.  Her laboratories from [facility] were reviewed, and had a CBC that was essentially normal.  Her blood sugar there was 130.  Urinalysis showed 1+ glucose, 1+ nitrate positive, 3+ urine white blood cells and 2+ urine bacteria.  We will repeat that urinalysis.  Drug screen was completely negative.  Respiratory panel for influenza A, B, respiratory syncytial virus and COVID were all negative.  RPR was previously nonreactive.  Her AST was mildly elevated at 45.  Blood alcohol was 0.21.  Her vital signs are stable, she is afebrile.  Pulse oximetry on room air was 95%.  She slept 5.75 hours last night, her CIWA this a.m. was 1.    Observation Level/Precautions:  Detox 15 minute checks  Laboratory:  CBC Chemistry Profile HCG UDS  Psychotherapy:    Medications:    Consultations:    Discharge Concerns:    Estimated LOS:  Other:      Physician Treatment Plan for Primary Diagnosis: <principal problem not specified> Long Term Goal(s): Improvement in symptoms so as ready for discharge  Short Term Goals: Ability to identify changes in lifestyle to reduce recurrence of condition will improve, Ability to verbalize feelings will improve, Ability to disclose and discuss suicidal ideas, Ability to demonstrate self-control will improve, Ability to identify and develop effective coping behaviors will improve, Ability to maintain clinical measurements within normal limits will improve, and Ability to identify triggers associated with substance abuse/mental health issues will improve  Physician Treatment Plan for Secondary Diagnosis: Active Problems:   MDD (major depressive disorder), recurrent severe, without psychosis (HCC)  Long Term Goal(s): Improvement in symptoms so as ready for discharge  Short Term Goals: Ability to identify changes in lifestyle to reduce recurrence of condition will improve, Ability to verbalize feelings will improve, Ability to disclose and discuss suicidal ideas, Ability to demonstrate self-control will improve, Ability to identify and develop effective coping behaviors will improve, Ability to maintain clinical measurements within normal  limits will improve, and Ability to identify triggers associated with substance abuse/mental health issues will improve  I certify that inpatient services furnished can reasonably be expected to improve the patient's condition.    Antonieta Pert, MD 6/14/20224:57 PM

## 2020-08-04 LAB — HEMOGLOBIN A1C
Hgb A1c MFr Bld: 6.6 % — ABNORMAL HIGH (ref 4.8–5.6)
Mean Plasma Glucose: 143 mg/dL

## 2020-08-04 LAB — GLUCOSE, CAPILLARY
Glucose-Capillary: 158 mg/dL — ABNORMAL HIGH (ref 70–99)
Glucose-Capillary: 84 mg/dL (ref 70–99)
Glucose-Capillary: 162 mg/dL — ABNORMAL HIGH (ref 70–99)

## 2020-08-04 NOTE — Progress Notes (Signed)
Adult Psychoeducational Group Note  Date:  08/04/2020 Time:  9:56 AM  Group Topic/Focus:  Goals Group:   The focus of this group is to help patients establish daily goals to achieve during treatment and discuss how the patient can incorporate goal setting into their daily lives to aide in recovery.  Participation Level:  Active  Participation Quality:  Appropriate and Attentive  Affect:  Appropriate  Cognitive:  Alert and Appropriate  Insight: Appropriate and Good  Engagement in Group:  Engaged  Modes of Intervention:  Discussion  Additional Comments:  Pt's goal for the day is to work on not worrying about things that are out of her control. Pt plans to use her family, support and  journaling to aide her complete the goal.   Anna Rojas Laronda Lisby 08/04/2020, 9:56 AM

## 2020-08-04 NOTE — Plan of Care (Signed)
  Problem: Education: Goal: Emotional status will improve Outcome: Progressing Goal: Mental status will improve Outcome: Progressing   

## 2020-08-04 NOTE — Progress Notes (Signed)
Psychoeducational Group Note  Date:  08/04/2020 Time:  2053  Group Topic/Focus:  Wrap-Up Group:   The focus of this group is to help patients review their daily goal of treatment and discuss progress on daily workbooks.  Participation Level: Did Not Attend  Participation Quality:  Not Applicable  Affect:  Not Applicable  Cognitive:  Not Applicable  Insight:  Not Applicable  Engagement in Group: Not Applicable  Additional Comments:  The patient did not attend group this evening.   Hazle Coca S 08/04/2020, 8:53 PM

## 2020-08-04 NOTE — Tx Team (Signed)
Interdisciplinary Treatment and Diagnostic Plan Update  08/04/2020 Time of Session: 9:40am Anna Rojas MRN: [id number]  Principal Diagnosis: <principal problem not specified>  Secondary Diagnoses: Active Problems:   MDD (major depressive disorder), recurrent severe, without psychosis (HCC)   Current Medications:  Current Facility-Administered Medications  Medication Dose Route Frequency Provider Last Rate Last Admin   acetaminophen (TYLENOL) tablet 650 mg  650 mg Oral Q6H PRN Ajibola, Ene A, NP       alum & mag hydroxide-simeth (MAALOX/MYLANTA) 200-200-20 MG/5ML suspension 30 mL  30 mL Oral Q4H PRN Ajibola, Ene A, NP       dapagliflozin propanediol (FARXIGA) tablet 5 mg  5 mg Oral Daily Antonieta Pert, MD   5 mg at 08/04/20 0920   eplerenone (INSPRA) tablet 25 mg  25 mg Oral Daily Mason Jim, Amy E, MD   25 mg at 08/04/20 0920   ferrous sulfate tablet 325 mg  325 mg Oral BID WC Antonieta Pert, MD   325 mg at 08/03/20 1644   fluticasone (FLOVENT HFA) 44 MCG/ACT inhaler 2 puff  2 puff Inhalation BID Antonieta Pert, MD   2 puff at 08/04/20 0917   hydrOXYzine (ATARAX/VISTARIL) tablet 25 mg  25 mg Oral Q6H PRN Ajibola, Ene A, NP   25 mg at 08/03/20 0754   insulin aspart (novoLOG) injection 0-15 Units  0-15 Units Subcutaneous TID WC Antonieta Pert, MD   3 Units at 08/04/20 0713   insulin glargine (LANTUS) injection 15 Units  15 Units Subcutaneous QHS Comer Locket, MD   15 Units at 08/03/20 2057   levothyroxine (SYNTHROID) tablet 150 mcg  150 mcg Oral Q0600 Antonieta Pert, MD   150 mcg at 08/04/20 0631   loperamide (IMODIUM) capsule 2-4 mg  2-4 mg Oral PRN Ajibola, Ene A, NP       LORazepam (ATIVAN) tablet 1 mg  1 mg Oral Q6H PRN Ajibola, Ene A, NP       magnesium hydroxide (MILK OF MAGNESIA) suspension 30 mL  30 mL Oral Daily PRN Ajibola, Ene A, NP       metFORMIN (GLUCOPHAGE) tablet 1,000 mg  1,000 mg Oral BID WC Antonieta Pert, MD   1,000 mg at 08/04/20 0947    nicotine (NICODERM CQ - dosed in mg/24 hours) patch 14 mg  14 mg Transdermal Daily Ajibola, Ene A, NP   14 mg at 08/04/20 0924   ondansetron (ZOFRAN-ODT) disintegrating tablet 4 mg  4 mg Oral Q6H PRN Ajibola, Ene A, NP       pantoprazole (PROTONIX) EC tablet 40 mg  40 mg Oral Daily Antonieta Pert, MD   40 mg at 08/04/20 0931   rosuvastatin (CRESTOR) tablet 40 mg  40 mg Oral Daily Antonieta Pert, MD   40 mg at 08/04/20 0931   sertraline (ZOLOFT) tablet 25 mg  25 mg Oral Daily Antonieta Pert, MD   25 mg at 08/04/20 0962   thiamine tablet 100 mg  100 mg Oral Daily Ajibola, Ene A, NP   100 mg at 08/04/20 0931   traZODone (DESYREL) tablet 50 mg  50 mg Oral QHS PRN Ajibola, Ene A, NP   50 mg at 08/03/20 2056   PTA Medications: Medications Prior to Admission  Medication Sig Dispense Refill Last Dose   diclofenac Sodium (VOLTAREN) 1 % GEL Place 1 application onto the skin 4 (four) times daily as needed.      lactulose (CHRONULAC) 10 GM/15ML solution Take  20 g by mouth 2 (two) times daily.      aspirin EC 81 MG tablet Take 81 mg by mouth daily.      citalopram (CELEXA) 20 MG tablet Take 20 mg by mouth daily.      eplerenone (INSPRA) 25 MG tablet Take 25 mg by mouth daily.      FARXIGA 5 MG TABS tablet Take 5 mg by mouth daily.      metFORMIN (GLUCOPHAGE) 500 MG tablet Take 500 mg by mouth 2 (two) times daily with a meal.      montelukast (SINGULAIR) 10 MG tablet Take 1 tablet by mouth daily.      omeprazole (PRILOSEC) 40 MG capsule Take 40 mg by mouth daily.      rosuvastatin (CRESTOR) 20 MG tablet Take 20 mg by mouth daily.      spironolactone (ALDACTONE) 25 MG tablet Take 1 tablet by mouth daily.      SYMBICORT 160-4.5 MCG/ACT inhaler Inhale 2 puffs into the lungs 2 (two) times daily.      SYNTHROID 150 MCG tablet Take 150 mcg by mouth daily.      TRESIBA FLEXTOUCH 100 UNIT/ML SOPN FlexTouch Pen Inject 16 Units into the skin at bedtime.       Patient Stressors: Loss of  friend Medication change or noncompliance  Patient Strengths: Geographical information systems officer for treatment/growth  Treatment Modalities: Medication Management, Group therapy, Case management,  1 to 1 session with clinician, Psychoeducation, Recreational therapy.   Physician Treatment Plan for Primary Diagnosis: <principal problem not specified> Long Term Goal(s): Improvement in symptoms so as ready for discharge   Short Term Goals: Ability to identify changes in lifestyle to reduce recurrence of condition will improve Ability to verbalize feelings will improve Ability to disclose and discuss suicidal ideas Ability to demonstrate self-control will improve Ability to identify and develop effective coping behaviors will improve Ability to maintain clinical measurements within normal limits will improve Ability to identify triggers associated with substance abuse/mental health issues will improve  Medication Management: Evaluate patient's response, side effects, and tolerance of medication regimen.  Therapeutic Interventions: 1 to 1 sessions, Unit Group sessions and Medication administration.  Evaluation of Outcomes: Progressing  Physician Treatment Plan for Secondary Diagnosis: Active Problems:   MDD (major depressive disorder), recurrent severe, without psychosis (HCC)  Long Term Goal(s): Improvement in symptoms so as ready for discharge   Short Term Goals: Ability to identify changes in lifestyle to reduce recurrence of condition will improve Ability to verbalize feelings will improve Ability to disclose and discuss suicidal ideas Ability to demonstrate self-control will improve Ability to identify and develop effective coping behaviors will improve Ability to maintain clinical measurements within normal limits will improve Ability to identify triggers associated with substance abuse/mental health issues will improve     Medication Management: Evaluate patient's response,  side effects, and tolerance of medication regimen.  Therapeutic Interventions: 1 to 1 sessions, Unit Group sessions and Medication administration.  Evaluation of Outcomes: Progressing   RN Treatment Plan for Primary Diagnosis: <principal problem not specified> Long Term Goal(s): Knowledge of disease and therapeutic regimen to maintain health will improve  Short Term Goals: Ability to remain free from injury will improve, Ability to participate in decision making will improve, Ability to verbalize feelings will improve, Ability to disclose and discuss suicidal ideas, and Ability to identify and develop effective coping behaviors will improve  Medication Management: RN will administer medications as ordered by provider, will assess and  evaluate patient's response and provide education to patient for prescribed medication. RN will report any adverse and/or side effects to prescribing provider.  Therapeutic Interventions: 1 on 1 counseling sessions, Psychoeducation, Medication administration, Evaluate responses to treatment, Monitor vital signs and CBGs as ordered, Perform/monitor CIWA, COWS, AIMS and Fall Risk screenings as ordered, Perform wound care treatments as ordered.  Evaluation of Outcomes: Progressing   LCSW Treatment Plan for Primary Diagnosis: <principal problem not specified> Long Term Goal(s): Safe transition to appropriate next level of care at discharge, Engage patient in therapeutic group addressing interpersonal concerns.  Short Term Goals: Engage patient in aftercare planning with referrals and resources, Increase social support, Facilitate acceptance of mental health diagnosis and concerns, Facilitate patient progression through stages of change regarding substance use diagnoses and concerns, Identify triggers associated with mental health/substance abuse issues, and Increase skills for wellness and recovery  Therapeutic Interventions: Assess for all discharge needs, 1 to 1  time with Social worker, Explore available resources and support systems, Assess for adequacy in community support network, Educate family and significant other(s) on suicide prevention, Complete Psychosocial Assessment, Interpersonal group therapy.  Evaluation of Outcomes: Progressing   Progress in Treatment: Attending groups: Yes. Participating in groups: Yes. Taking medication as prescribed: Yes. Toleration medication: Yes. Family/Significant other contact made: Yes, individual(s) contacted:  Fiance Patient understands diagnosis: Yes. and No. Discussing patient identified problems/goals with staff: Yes. Medical problems stabilized or resolved: Yes. Denies suicidal/homicidal ideation: Yes. Issues/concerns per patient self-inventory: No.   New problem(s) identified: No, Describe:  None   New Short Term/Long Term Goal(s): medication stabilization, elimination of SI thoughts, development of comprehensive mental wellness plan.   Patient Goals: Did not attend   Discharge Plan or Barriers: Patient recently admitted. CSW will continue to follow and assess for appropriate referrals and possible discharge planning.   Reason for Continuation of Hospitalization: Depression Medication stabilization Suicidal ideation Withdrawal symptoms  Estimated Length of Stay: 3 toy 5 days   Attendees: Patient: Did not attend  08/04/2020   Physician: Landry Mellow, MD 08/04/2020   Nursing:  08/04/2020   RN Care Manager: 08/04/2020   Social Worker: Melba Coon, LCSWA  08/04/2020   Recreational Therapist:  08/04/2020   Other:  08/04/2020   Other:  08/04/2020   Other: 08/04/2020     Scribe for Treatment Team: Aram Beecham, LCSWA 08/04/2020 12:28 PM

## 2020-08-04 NOTE — Progress Notes (Signed)
Pt presents pleasant and cooperative with care. She denied SI/HI/AVH or self harm thoughts. She was observed interacting with her peers and staff appropriately. She denied w/d's r/t ETOH use and was med compliant. She reports good appetite, hydration and elimination. She requested a sleep aid - Trazodone administered with good effect. She is currently resting in bed with + even and unlabored respirations. She remains on moderate falls risk with no falls or unsafe behavior noted thus far. Q15 min observations maintained for safety and support provided as needed.   08/04/20 0000  Psych Admission Type (Psych Patients Only)  Admission Status Voluntary  Psychosocial Assessment  Patient Complaints None  Eye Contact Brief;Fair  Facial Expression Animated  Affect Appropriate to circumstance  Speech Logical/coherent  Interaction Other (Comment) (Pleasant)  Motor Activity Slow  Appearance/Hygiene Unremarkable  Behavior Characteristics Cooperative;Calm;Appropriate to situation  Mood Pleasant;Euthymic  Thought Careers adviser  Content WDL  Delusions None reported or observed  Perception WDL  Hallucination None reported or observed  Judgment Poor  Confusion None  Danger to Self  Current suicidal ideation? Denies  Danger to Others  Danger to Others None reported or observed

## 2020-08-04 NOTE — Progress Notes (Signed)
[facility] MD Progress Note  08/04/2020 6:23 PM Anna Rojas  MRN:  [id number]  Subjective: Anna Rojas reports, "I'm feeling better. I know now that there is nothing I could have done about not being able to visit my dear friend prior to her death. I hope she knows how much I cared about her. I'm trying to pick myself up gain. The guilt feeling is getting better".  Reason for admission: Patient is a 65 year old female with a past psychiatric history significant for alcohol dependence and depression who presented to the [facility] emergency department on 08/03/2020 with suicidal ideation.  The patient stated that her good friend had recently died in hospice, and then went to visit her, and triggered emotions from the death of her sister in the past.  Objective: [name] is seen, chart reviewed. The chart findings discussed with the treatment team. She is alert, oriented x 3. She is visible on the unit, attending group session. She presents with a good affect, good eye contact. She reports she is in the hospital because she relapsed on alcohol. She relapsed on alcohol because she was feeling a lot of guilt & the guilt is because she was not able to visit her good friend who was receiving hospice care prior to her death few weeks ago. [name] says being in the hospital & reflecting on how she was feeling, she realized now that there is nothing else she could do differently now that her friend is no longer living.  She says she drank for 2 weeks out of guilt. She reports feeling better. She says her sleep has improved. She denies any alcohol withdrawal symptoms. She denies any SIHI, AVH, delusional thoughts or paranoia. She does not appear to be responding to any internal stimuli. Vtal signs are stable.  Principal Problem: MDD (major depressive disorder), recurrent severe, without psychosis (HCC)  Diagnosis: Principal Problem:   MDD (major depressive disorder), recurrent severe, without psychosis (HCC)  Total  Time spent with patient:  25 minutes  Past Psychiatric History: See H&P  Past Medical History:  Past Medical History:  Diagnosis Date   AICD (automatic cardioverter/defibrillator) present    CHF (congestive heart failure) (HCC)    COPD (chronic obstructive pulmonary disease) (HCC)    Diabetes mellitus without complication (HCC)    Hypothyroidism     Past Surgical History:  Procedure Laterality Date   CARDIAC DEFIBRILLATOR PLACEMENT     PACEMAKER PLACEMENT     Family History:  Family History  Problem Relation Age of Onset   Brain cancer Mother    Lung cancer Sister    Family Psychiatric  History: See H&P  Social History:  Social History   Substance and Sexual Activity  Alcohol Use Yes     Social History   Substance and Sexual Activity  Drug Use Not Currently    Social History   Socioeconomic History   Marital status: Divorced    Spouse name: Not on file   Number of children: Not on file   Years of education: Not on file   Highest education level: Not on file  Occupational History   Not on file  Tobacco Use   Smoking status: Every Day    Packs/day: 0.50    Years: 3.00    Pack years: 1.50    Types: Cigarettes   Smokeless tobacco: Never  Vaping Use   Vaping Use: Never used  Substance and Sexual Activity   Alcohol use: Yes   Drug use: Not Currently  Sexual activity: Yes    Birth control/protection: Condom  Other Topics Concern   Not on file  Social History Narrative   Not on file   Social Determinants of Health   Financial Resource Strain: Not on file  Food Insecurity: Not on file  Transportation Needs: Not on file  Physical Activity: Not on file  Stress: Not on file  Social Connections: Not on file   Additional Social History:    Pain Medications: Denies abuse Prescriptions: Denies abuse Over the Counter: Denies abuse History of alcohol / drug use?: Yes Longest period of sobriety (when/how long): 2.5 years Negative Consequences of Use:  Personal relationships Name of Substance 1: Alcohol 1 - Age of First Use: unknown 1 - Amount (size/oz): 18 beers 1 - Frequency: daily 1 - Duration: 2 weeks this episode 1 - Last Use / Amount: 08/01/2020 1 - Method of Aquiring: Store 1- Route of Use: Oral  Sleep: Good  Appetite:  Good  Current Medications: Current Facility-Administered Medications  Medication Dose Route Frequency Provider Last Rate Last Admin   acetaminophen (TYLENOL) tablet 650 mg  650 mg Oral Q6H PRN Ajibola, Ene A, NP       alum & mag hydroxide-simeth (MAALOX/MYLANTA) 200-200-20 MG/5ML suspension 30 mL  30 mL Oral Q4H PRN Ajibola, Ene A, NP       dapagliflozin propanediol (FARXIGA) tablet 5 mg  5 mg Oral Daily Jujhar Everett I, NP   5 mg at 08/04/20 0920   eplerenone (INSPRA) tablet 25 mg  25 mg Oral Daily Elliot Meldrum, Nicole Kindred I, NP   25 mg at 08/04/20 0920   ferrous sulfate tablet 325 mg  325 mg Oral BID WC Antonieta Pert, MD   325 mg at 08/04/20 1820   fluticasone (FLOVENT HFA) 44 MCG/ACT inhaler 2 puff  2 puff Inhalation BID Antonieta Pert, MD   2 puff at 08/04/20 0917   hydrOXYzine (ATARAX/VISTARIL) tablet 25 mg  25 mg Oral Q6H PRN Ajibola, Ene A, NP   25 mg at 08/03/20 0754   insulin aspart (novoLOG) injection 0-15 Units  0-15 Units Subcutaneous TID WC Antonieta Pert, MD   3 Units at 08/04/20 0713   insulin glargine (LANTUS) injection 15 Units  15 Units Subcutaneous QHS Comer Locket, MD   15 Units at 08/03/20 2057   levothyroxine (SYNTHROID) tablet 150 mcg  150 mcg Oral Q0600 Antonieta Pert, MD   150 mcg at 08/04/20 0631   loperamide (IMODIUM) capsule 2-4 mg  2-4 mg Oral PRN Ajibola, Ene A, NP       LORazepam (ATIVAN) tablet 1 mg  1 mg Oral Q6H PRN Ajibola, Ene A, NP       magnesium hydroxide (MILK OF MAGNESIA) suspension 30 mL  30 mL Oral Daily PRN Ajibola, Ene A, NP       metFORMIN (GLUCOPHAGE) tablet 1,000 mg  1,000 mg Oral BID WC Antonieta Pert, MD   1,000 mg at 08/04/20 1820   nicotine  (NICODERM CQ - dosed in mg/24 hours) patch 14 mg  14 mg Transdermal Daily Ajibola, Ene A, NP   14 mg at 08/04/20 0924   ondansetron (ZOFRAN-ODT) disintegrating tablet 4 mg  4 mg Oral Q6H PRN Ajibola, Ene A, NP       pantoprazole (PROTONIX) EC tablet 40 mg  40 mg Oral Daily Antonieta Pert, MD   40 mg at 08/04/20 0931   rosuvastatin (CRESTOR) tablet 40 mg  40 mg Oral Daily Clary,  Marlane Mingle, MD   40 mg at 08/04/20 0931   sertraline (ZOLOFT) tablet 25 mg  25 mg Oral Daily Antonieta Pert, MD   25 mg at 08/04/20 0931   thiamine tablet 100 mg  100 mg Oral Daily Ajibola, Ene A, NP   100 mg at 08/04/20 0931   traZODone (DESYREL) tablet 50 mg  50 mg Oral QHS PRN Ajibola, Ene A, NP   50 mg at 08/03/20 2056    Lab Results:  Results for orders placed or performed during the hospital encounter of 08/02/20 (from the past 48 hour(s))  Glucose, capillary     Status: Abnormal   Collection Time: 08/02/20 10:47 PM  Result Value Ref Range   Glucose-Capillary 173 (H) 70 - 99 mg/dL    Comment: Glucose reference range applies only to samples taken after fasting for at least 8 hours.  Urinalysis, Complete w Microscopic Urine, Unspecified Source     Status: Abnormal   Collection Time: 08/03/20 12:01 PM  Result Value Ref Range   Color, Urine COLORLESS (A) YELLOW   APPearance CLEAR CLEAR   Specific Gravity, Urine 1.003 (L) 1.005 - 1.030   pH 6.0 5.0 - 8.0   Glucose, UA NEGATIVE NEGATIVE mg/dL   Hgb urine dipstick NEGATIVE NEGATIVE   Bilirubin Urine NEGATIVE NEGATIVE   Ketones, ur NEGATIVE NEGATIVE mg/dL   Protein, ur NEGATIVE NEGATIVE mg/dL   Nitrite NEGATIVE NEGATIVE   Leukocytes,Ua NEGATIVE NEGATIVE   WBC, UA 0-5 0 - 5 WBC/hpf   Bacteria, UA NONE SEEN NONE SEEN   Squamous Epithelial / LPF 0-5 0 - 5    Comment: Performed at [facility], [address], [place], [place] [address]  Glucose, capillary     Status: Abnormal   Collection Time: 08/03/20  5:13 PM  Result Value Ref  Range   Glucose-Capillary 135 (H) 70 - 99 mg/dL    Comment: Glucose reference range applies only to samples taken after fasting for at least 8 hours.   Comment 1 Notify RN    Comment 2 Document in Chart   Hemoglobin A1c     Status: Abnormal   Collection Time: 08/03/20  6:15 PM  Result Value Ref Range   Hgb A1c MFr Bld 6.6 (H) 4.8 - 5.6 %    Comment: (NOTE)         Prediabetes: 5.7 - 6.4         Diabetes: >6.4         Glycemic control for adults with diabetes: <7.0    Mean Plasma Glucose 143 mg/dL    Comment: (NOTE) Performed At: [facility] [address] [place], [place] [id number] Jolene Schimke MD OE:[id number]   TSH     Status: Abnormal   Collection Time: 08/03/20  6:15 PM  Result Value Ref Range   TSH 19.177 (H) 0.350 - 4.500 uIU/mL    Comment: Performed by a 3rd Generation assay with a functional sensitivity of <=0.01 uIU/mL. Performed at [facility], [address] [place], [place] [address]   Brain natriuretic peptide     Status: None   Collection Time: 08/03/20  6:15 PM  Result Value Ref Range   B Natriuretic Peptide 46.7 0.0 - 100.0 pg/mL    Comment: Performed at [facility], [address], [place], [place] [address]  Glucose, capillary     Status: Abnormal   Collection Time: 08/03/20  8:52 PM  Result Value Ref Range  Glucose-Capillary 222 (H) 70 - 99 mg/dL    Comment: Glucose reference range applies only to samples taken after fasting for at least 8 hours.  Glucose, capillary     Status: Abnormal   Collection Time: 08/04/20  6:29 AM  Result Value Ref Range   Glucose-Capillary 158 (H) 70 - 99 mg/dL    Comment: Glucose reference range applies only to samples taken after fasting for at least 8 hours.  Glucose, capillary     Status: None   Collection Time: 08/04/20 11:44 AM  Result Value Ref Range   Glucose-Capillary 84 70 - 99 mg/dL    Comment: Glucose reference range applies only to samples taken after fasting  for at least 8 hours.    Blood Alcohol level:  Lab Results  Component Value Date   ETH <10 11/21/2018    Metabolic Disorder Labs: Lab Results  Component Value Date   HGBA1C 6.6 (H) 08/03/2020   MPG 143 08/03/2020   MPG 271.87 11/22/2018   No results found for: PROLACTIN Lab Results  Component Value Date   CHOL 150 11/22/2018   TRIG 128 11/22/2018   HDL 39 (L) 11/22/2018   CHOLHDL 3.8 11/22/2018   VLDL 26 11/22/2018   LDLCALC 85 11/22/2018    Physical Findings: AIMS:  , ,  ,  ,    CIWA:  CIWA-Ar Total: 1 COWS:     Musculoskeletal: Strength & Muscle Tone: within normal limits Gait & Station: normal Patient leans: N/A  Psychiatric Specialty Exam:  Presentation  General Appearance: Disheveled  Eye Contact:Fair  Speech:Normal Rate  Speech Volume:Normal  Handedness:Right  Mood and Affect  Mood:Depressed  Affect:Congruent  Thought Process  Thought Processes:Goal Directed  Descriptions of Associations:Intact  Orientation:Full (Time, Place and Person)  Thought Content:Logical  History of Schizophrenia/Schizoaffective disorder:No  Duration of Psychotic Symptoms:No data recorded Hallucinations:Hallucinations: None  Ideas of Reference:None  Suicidal Thoughts:Suicidal Thoughts: Yes, Passive SI Passive Intent and/or Plan: Without Intent  Homicidal Thoughts:Homicidal Thoughts: No  Sensorium  Memory:Immediate Good; Recent Good; Remote Good  Judgment:Fair  Insight:Fair  Executive Functions  Concentration:Fair  Attention Span:Fair  Recall:Fair  Fund of Knowledge:Fair  Language:Fair  Psychomotor Activity  Psychomotor Activity:Psychomotor Activity: Normal  Assets  Assets:Desire for Improvement; Financial Resources/Insurance; Housing; Social Support  Sleep  Sleep:Sleep: Fair Number of Hours of Sleep: 5.5  Physical Exam: Physical Exam Vitals and nursing note reviewed.  HENT:     Head: Normocephalic.     Nose: Nose normal.      Mouth/Throat:     Pharynx: Oropharynx is clear.  Eyes:     Pupils: Pupils are equal, round, and reactive to light.  Cardiovascular:     Rate and Rhythm: Normal rate.     Pulses: Normal pulses.  Pulmonary:     Effort: Pulmonary effort is normal.  Genitourinary:    Comments: Deferred Musculoskeletal:        General: Normal range of motion.     Cervical back: Normal range of motion.  Skin:    General: Skin is warm and dry.  Neurological:     General: No focal deficit present.     Mental Status: She is alert and oriented to person, place, and time.   Review of Systems  Constitutional: Negative.   HENT: Negative.    Eyes: Negative.   Respiratory: Negative.    Cardiovascular: Negative.   Gastrointestinal: Negative.   Genitourinary: Negative.   Musculoskeletal: Negative.   Skin: Negative.   Neurological: Negative.   Endo/Heme/Allergies:  Negative.   Psychiatric/Behavioral:  Positive for depression ("Improving") and substance abuse (Hx. alcoholism). Negative for hallucinations, memory loss and suicidal ideas. The patient is not nervous/anxious and does not have insomnia.   Blood pressure 123/69, pulse 75, temperature 100.1 F (37.8 C), temperature source Oral, resp. rate 20, height 5' 1.75" (1.568 m), weight 91.6 kg, SpO2 94 %. Body mass index is 37.25 kg/m.  Treatment Plan Summary: Daily contact with patient to assess and evaluate symptoms and progress in treatment and Medication management.   Continue inpatient hospitalization. Will continue today 08/04/2020 plan as below except where it is noted.   Depression.  Continue Sertraline 25 mg po daily.  Anxiety/CIWA.  Continue Vistaril 25 mg po Q 6 hours prn/CIWA >10. Continue Lorazepam 1 mg po Q 6 hrs prn for CIWA >10.   Insomnia. Continue Trazodone 50 mg po prn Q hs.  Other medical issues: Continue,  Farxiga 5 mg po daily for DM. Eplerenone (INSPRA) 25 mg po daily. Ferrous sulfate 325 mg po bif for anemia. Fluticasone 2  puffs bid for SOB. Continue sliding scaled insulin coverage tid with meals per blood sugar results. Lantus 15 units subq Q hs for DM. Synthroid 150 mcg po Q am for hypothyroidism.  Metformin 1.000 mg po bid for DM.  Nicotine patch 14 mg topically Q 24 hrs for nicotine withdrawal management.  Protonix 40 mg po Q am for GERD. Crestor 40 mg po Q 1200 noon for hyperlipidemia.  Thiamine 100 mg po daily for thiamine replacement.  Continue all other prn medications as recommended. Encourage group participation. Discharge disposition plan is ongoing.  Armandina StammerAgnes Racer Quam, NP, pmhnp, fnp-bc 08/04/2020, 6:23 PM

## 2020-08-04 NOTE — Progress Notes (Signed)
D- Patient alert and oriented. Patient affect/mood stable. Denies SI, HI, AVH, and pain. Patient accucheck AC/HS with coverage. C.I.W.A rated as 0-2 rating.   A- Scheduled medications administered to patient, per MD orders. Support and encouragement provided.  Routine safety checks conducted every 15 minutes.  Patient informed to notify staff with problems or concerns.  R- No adverse drug reactions noted. Patient contracts for safety at this time. Patient compliant with medications and treatment plan. Patient receptive, calm, and cooperative. Patient interacts well with others on the unit.  Patient remains safe at this time.             [place] NOVEL CORONAVIRUS (COVID-19) DAILY CHECK-OFF SYMPTOMS - answer yes or no to each - every day NO YES  Have you had a fever in the past 24 hours?  Fever (Temp > 37.80C / 100F) X    Have you had any of these symptoms in the past 24 hours? New Cough  Sore Throat   Shortness of Breath  Difficulty Breathing  Unexplained Body Aches   X    Have you had any one of these symptoms in the past 24 hours not related to allergies?   Runny Nose  Nasal Congestion  Sneezing   X    If you have had runny nose, nasal congestion, sneezing in the past 24 hours, has it worsened?   X    EXPOSURES - check yes or no X    Have you traveled outside the state in the past 14 days?   X    Have you been in contact with someone with a confirmed diagnosis of COVID-19 or PUI in the past 14 days without wearing appropriate PPE?   X    Have you been living in the same home as a person with confirmed diagnosis of COVID-19 or a PUI (household contact)?     X    Have you been diagnosed with COVID-19?     X                                                                                                                             What to do next: Answered NO to all: Answered YES to anything:    Proceed with unit schedule Follow the [facility] Inpatient Flowsheet.

## 2020-08-04 NOTE — Progress Notes (Signed)
Recreation Therapy Notes  Date: 6.15.22 Time: 0930 Location: [address] Dayroom  Group Topic: Stress Management   Goal Area(s) Addresses:  Patient will actively participate in stress management techniques presented during session.  Patient will successfully identify benefit of practicing stress management post d/c.   Behavioral Response: Appropriate  Intervention: Guided exercise with ambient sound and script  Activity :Guided Imagery  LRT provided education, instruction, and demonstration on practice of visualization via guided imagery. Patient was asked to participate in the technique introduced during session. LRT also debriefed including topics of mindfulness, stress management and specific scenarios each patient could use these techniques. Patients were given suggestions of ways to access scripts post d/c and encouraged to explore Youtube and other apps available on smartphones, tablets, and computers.   Education:  Stress Management, Discharge Planning.   Education Outcome: Acknowledges education  Clinical Observations/Feedback: Patient actively engaged in technique introduced, expressed no concerns.  Pt expressed feeling calm and relaxed when group ended.    Caroll Rancher, LRT/CTRS     Caroll Rancher A 08/04/2020 11:27 AM

## 2020-08-05 LAB — GLUCOSE, CAPILLARY
Glucose-Capillary: 146 mg/dL — ABNORMAL HIGH (ref 70–99)
Glucose-Capillary: 100 mg/dL — ABNORMAL HIGH (ref 70–99)
Glucose-Capillary: 137 mg/dL — ABNORMAL HIGH (ref 70–99)
Glucose-Capillary: 142 mg/dL — ABNORMAL HIGH (ref 70–99)

## 2020-08-05 NOTE — Progress Notes (Signed)
Progress Notes:    08/05/20 1245  Psych Admission Type (Psych Patients Only)  Admission Status Voluntary  Psychosocial Assessment  Patient Complaints None  Eye Contact Brief;Fair  Facial Expression Animated  Affect Appropriate to circumstance  Speech Logical/coherent  Interaction Assertive  Motor Activity Slow  Appearance/Hygiene Unremarkable  Behavior Characteristics Cooperative;Appropriate to situation  Mood Pleasant  Thought Process  Coherency WDL  Content WDL  Delusions None reported or observed  Perception WDL  Hallucination None reported or observed  Judgment Poor  Confusion None  Danger to Self  Current suicidal ideation? Denies  Danger to Others  Danger to Others None reported or observed

## 2020-08-05 NOTE — Progress Notes (Signed)
[facility] MD Progress Note  08/05/2020 4:04 PM Karlie Aung  MRN:  [id number]  Subjective: Anallely reports, "It is going great today. I think that I'm ready if I have to be discharged today or tomorrow. I'm happy go lucky today. I will be going back home in [place], [place]. No more guilt trip for me".  Reason for admission: Patient is a 65 year old female with a past psychiatric history significant for alcohol dependence and depression who presented to the [facility] emergency department on 08/03/2020 with suicidal ideation.  The patient stated that her good friend had recently died in hospice, and then went to visit her, and triggered emotions from the death of her sister in the past.  Objective: Arianah is seen, chart reviewed. The chart findings discussed with the treatment team. She is alert, oriented x 3. She is visible on the unit, attending group session. She presents with a good affect, good eye contact. She reported yesterday that she is in the hospital because she relapsed on alcohol. She relapsed on alcohol because she was feeling a lot of guilt & the guilt is because she was not able to visit her good friend who was receiving hospice care prior to her death few weeks ago. Maicie says being in the hospital & reflecting on how she was feeling, she realized now that there is nothing else she could do differently now that her friend is no longer living.  She says she drank for 2 weeks out of guilt. She reports feeling better again today, feeling ready to be discharged. She says her sleep is good. She denies any alcohol withdrawal symptoms. She denies any SIHI, AVH, delusional thoughts or paranoia. She does not appear to be responding to any internal stimuli. Vtal signs are stable.  Principal Problem: MDD (major depressive disorder), recurrent severe, without psychosis (HCC)  Diagnosis: Principal Problem:   MDD (major depressive disorder), recurrent severe, without psychosis (HCC)  Total Time  spent with patient:  25 minutes  Past Psychiatric History: See H&P  Past Medical History:  Past Medical History:  Diagnosis Date   AICD (automatic cardioverter/defibrillator) present    CHF (congestive heart failure) (HCC)    COPD (chronic obstructive pulmonary disease) (HCC)    Diabetes mellitus without complication (HCC)    Hypothyroidism     Past Surgical History:  Procedure Laterality Date   CARDIAC DEFIBRILLATOR PLACEMENT     PACEMAKER PLACEMENT     Family History:  Family History  Problem Relation Age of Onset   Brain cancer Mother    Lung cancer Sister    Family Psychiatric  History: See H&P  Social History:  Social History   Substance and Sexual Activity  Alcohol Use Yes     Social History   Substance and Sexual Activity  Drug Use Not Currently    Social History   Socioeconomic History   Marital status: Divorced    Spouse name: Not on file   Number of children: Not on file   Years of education: Not on file   Highest education level: Not on file  Occupational History   Not on file  Tobacco Use   Smoking status: Every Day    Packs/day: 0.50    Years: 3.00    Pack years: 1.50    Types: Cigarettes   Smokeless tobacco: Never  Vaping Use   Vaping Use: Never used  Substance and Sexual Activity   Alcohol use: Yes   Drug use: Not Currently   Sexual activity:  Yes    Birth control/protection: Condom  Other Topics Concern   Not on file  Social History Narrative   Not on file   Social Determinants of Health   Financial Resource Strain: Not on file  Food Insecurity: Not on file  Transportation Needs: Not on file  Physical Activity: Not on file  Stress: Not on file  Social Connections: Not on file   Additional Social History:    Pain Medications: Denies abuse Prescriptions: Denies abuse Over the Counter: Denies abuse History of alcohol / drug use?: Yes Longest period of sobriety (when/how long): 2.5 years Negative Consequences of Use:  Personal relationships Name of Substance 1: Alcohol 1 - Age of First Use: unknown 1 - Amount (size/oz): 18 beers 1 - Frequency: daily 1 - Duration: 2 weeks this episode 1 - Last Use / Amount: 08/01/2020 1 - Method of Aquiring: Store 1- Route of Use: Oral  Sleep: Good  Appetite:  Good  Current Medications: Current Facility-Administered Medications  Medication Dose Route Frequency Provider Last Rate Last Admin   acetaminophen (TYLENOL) tablet 650 mg  650 mg Oral Q6H PRN Ajibola, Ene A, NP       alum & mag hydroxide-simeth (MAALOX/MYLANTA) 200-200-20 MG/5ML suspension 30 mL  30 mL Oral Q4H PRN Ajibola, Ene A, NP       dapagliflozin propanediol (FARXIGA) tablet 5 mg  5 mg Oral Daily Makari Portman I, NP   5 mg at 08/05/20 0812   eplerenone (INSPRA) tablet 25 mg  25 mg Oral Daily Gioia Ranes, Nicole KindredAgnes I, NP   25 mg at 08/05/20 0813   ferrous sulfate tablet 325 mg  325 mg Oral BID WC Antonieta Pertlary, Greg Lawson, MD   325 mg at 08/05/20 0813   fluticasone (FLOVENT HFA) 44 MCG/ACT inhaler 2 puff  2 puff Inhalation BID Antonieta Pertlary, Greg Lawson, MD   2 puff at 08/05/20 0811   hydrOXYzine (ATARAX/VISTARIL) tablet 25 mg  25 mg Oral Q6H PRN Ajibola, Ene A, NP   25 mg at 08/03/20 0754   insulin aspart (novoLOG) injection 0-15 Units  0-15 Units Subcutaneous TID WC Antonieta Pertlary, Greg Lawson, MD   2 Units at 08/05/20 0727   insulin glargine (LANTUS) injection 15 Units  15 Units Subcutaneous QHS Comer LocketSingleton, Amy E, MD   15 Units at 08/04/20 2120   levothyroxine (SYNTHROID) tablet 150 mcg  150 mcg Oral Q0600 Antonieta Pertlary, Greg Lawson, MD   150 mcg at 08/05/20 [id number]   loperamide (IMODIUM) capsule 2-4 mg  2-4 mg Oral PRN Ajibola, Ene A, NP       LORazepam (ATIVAN) tablet 1 mg  1 mg Oral Q6H PRN Ajibola, Ene A, NP       magnesium hydroxide (MILK OF MAGNESIA) suspension 30 mL  30 mL Oral Daily PRN Ajibola, Ene A, NP       metFORMIN (GLUCOPHAGE) tablet 1,000 mg  1,000 mg Oral BID WC Antonieta Pertlary, Greg Lawson, MD   1,000 mg at 08/05/20 [id number]   nicotine  (NICODERM CQ - dosed in mg/24 hours) patch 14 mg  14 mg Transdermal Daily Ajibola, Ene A, NP   14 mg at 08/05/20 0814   ondansetron (ZOFRAN-ODT) disintegrating tablet 4 mg  4 mg Oral Q6H PRN Ajibola, Ene A, NP       pantoprazole (PROTONIX) EC tablet 40 mg  40 mg Oral Daily Antonieta Pertlary, Greg Lawson, MD   40 mg at 08/05/20 0723   rosuvastatin (CRESTOR) tablet 40 mg  40 mg Oral Daily Antonieta Pertlary, Greg Lawson,  MD   40 mg at 08/05/20 0811   sertraline (ZOLOFT) tablet 25 mg  25 mg Oral Daily Antonieta Pert, MD   25 mg at 08/05/20 6789   thiamine tablet 100 mg  100 mg Oral Daily Ajibola, Ene A, NP   100 mg at 08/05/20 3810   traZODone (DESYREL) tablet 50 mg  50 mg Oral QHS PRN Ajibola, Ene A, NP   50 mg at 08/04/20 2122    Lab Results:  Results for orders placed or performed during the hospital encounter of 08/02/20 (from the past 48 hour(s))  Glucose, capillary     Status: Abnormal   Collection Time: 08/03/20  5:13 PM  Result Value Ref Range   Glucose-Capillary 135 (H) 70 - 99 mg/dL    Comment: Glucose reference range applies only to samples taken after fasting for at least 8 hours.   Comment 1 Notify RN    Comment 2 Document in Chart   Hemoglobin A1c     Status: Abnormal   Collection Time: 08/03/20  6:15 PM  Result Value Ref Range   Hgb A1c MFr Bld 6.6 (H) 4.8 - 5.6 %    Comment: (NOTE)         Prediabetes: 5.7 - 6.4         Diabetes: >6.4         Glycemic control for adults with diabetes: <7.0    Mean Plasma Glucose 143 mg/dL    Comment: (NOTE) Performed At: [facility] [address] [place], [place] [id number] Jolene Schimke MD ID:[id number]   TSH     Status: Abnormal   Collection Time: 08/03/20  6:15 PM  Result Value Ref Range   TSH 19.177 (H) 0.350 - 4.500 uIU/mL    Comment: Performed by a 3rd Generation assay with a functional sensitivity of <=0.01 uIU/mL. Performed at [facility], [address] [place], [place] [address]   Brain natriuretic peptide      Status: None   Collection Time: 08/03/20  6:15 PM  Result Value Ref Range   B Natriuretic Peptide 46.7 0.0 - 100.0 pg/mL    Comment: Performed at [facility], [address] [place], [place] [address]  Glucose, capillary     Status: Abnormal   Collection Time: 08/03/20  8:52 PM  Result Value Ref Range   Glucose-Capillary 222 (H) 70 - 99 mg/dL    Comment: Glucose reference range applies only to samples taken after fasting for at least 8 hours.  Glucose, capillary     Status: Abnormal   Collection Time: 08/04/20  6:29 AM  Result Value Ref Range   Glucose-Capillary 158 (H) 70 - 99 mg/dL    Comment: Glucose reference range applies only to samples taken after fasting for at least 8 hours.  Glucose, capillary     Status: None   Collection Time: 08/04/20 11:44 AM  Result Value Ref Range   Glucose-Capillary 84 70 - 99 mg/dL    Comment: Glucose reference range applies only to samples taken after fasting for at least 8 hours.  Glucose, capillary     Status: Abnormal   Collection Time: 08/04/20  8:30 PM  Result Value Ref Range   Glucose-Capillary 162 (H) 70 - 99 mg/dL    Comment: Glucose reference range applies only to samples taken after fasting for at least 8 hours.  Glucose, capillary     Status: Abnormal   Collection Time: 08/05/20  6:22 AM  Result Value  Ref Range   Glucose-Capillary 146 (H) 70 - 99 mg/dL    Comment: Glucose reference range applies only to samples taken after fasting for at least 8 hours.  Glucose, capillary     Status: Abnormal   Collection Time: 08/05/20 11:40 AM  Result Value Ref Range   Glucose-Capillary 100 (H) 70 - 99 mg/dL    Comment: Glucose reference range applies only to samples taken after fasting for at least 8 hours.    Blood Alcohol level:  Lab Results  Component Value Date   ETH <10 11/21/2018    Metabolic Disorder Labs: Lab Results  Component Value Date   HGBA1C 6.6 (H) 08/03/2020   MPG 143 08/03/2020   MPG 271.87  11/22/2018   No results found for: PROLACTIN Lab Results  Component Value Date   CHOL 150 11/22/2018   TRIG 128 11/22/2018   HDL 39 (L) 11/22/2018   CHOLHDL 3.8 11/22/2018   VLDL 26 11/22/2018   LDLCALC 85 11/22/2018    Physical Findings: AIMS:  , ,  ,  ,    CIWA:  CIWA-Ar Total: 1 COWS:     Musculoskeletal: Strength & Muscle Tone: within normal limits Gait & Station: normal Patient leans: N/A  Psychiatric Specialty Exam:  Presentation  General Appearance: Disheveled  Eye Contact:Fair  Speech:Normal Rate  Speech Volume:Normal  Handedness:Right  Mood and Affect  Mood:Depressed  Affect:Congruent  Thought Process  Thought Processes:Goal Directed  Descriptions of Associations:Intact  Orientation:Full (Time, Place and Person)  Thought Content:Logical  History of Schizophrenia/Schizoaffective disorder:No  Duration of Psychotic Symptoms:No data recorded Hallucinations:No data recorded  Ideas of Reference:None  Suicidal Thoughts:No data recorded  Homicidal Thoughts:No data recorded  Sensorium  Memory:Immediate Good; Recent Good; Remote Good  Judgment:Fair  Insight:Fair  Executive Functions  Concentration:Fair  Attention Span:Fair  Recall:Fair  Fund of Knowledge:Fair  Language:Fair  Psychomotor Activity  Psychomotor Activity:No data recorded  Assets  Assets:Desire for Improvement; Financial Resources/Insurance; Housing; Social Support  Sleep  Sleep:No data recorded  Physical Exam: Physical Exam Vitals and nursing note reviewed.  HENT:     Head: Normocephalic.     Nose: Nose normal.     Mouth/Throat:     Pharynx: Oropharynx is clear.  Eyes:     Pupils: Pupils are equal, round, and reactive to light.  Cardiovascular:     Rate and Rhythm: Normal rate.     Pulses: Normal pulses.  Pulmonary:     Effort: Pulmonary effort is normal.  Genitourinary:    Comments: Deferred Musculoskeletal:        General: Normal range of motion.      Cervical back: Normal range of motion.  Skin:    General: Skin is warm and dry.  Neurological:     General: No focal deficit present.     Mental Status: She is alert and oriented to person, place, and time.   Review of Systems  Constitutional: Negative.   HENT: Negative.    Eyes: Negative.   Respiratory: Negative.    Cardiovascular: Negative.   Gastrointestinal: Negative.   Genitourinary: Negative.   Musculoskeletal: Negative.   Skin: Negative.   Neurological: Negative.   Endo/Heme/Allergies: Negative.   Psychiatric/Behavioral:  Positive for depression ("Improving") and substance abuse (Hx. alcoholism). Negative for hallucinations, memory loss and suicidal ideas. The patient is not nervous/anxious and does not have insomnia.   Blood pressure (!) 111/56, pulse 77, temperature 98 F (36.7 C), temperature source Oral, resp. rate 20, height 5' 1.75" (1.568 m), weight  91.6 kg, SpO2 94 %. Body mass index is 37.25 kg/m.  Treatment Plan Summary: Daily contact with patient to assess and evaluate symptoms and progress in treatment and Medication management.   Continue inpatient hospitalization. Will continue today 08/05/2020 plan as below except where it is noted.   Depression.  Continue Sertraline 25 mg po daily.  Anxiety/CIWA.  Continue Vistaril 25 mg po Q 6 hours prn/CIWA >10. Continue Lorazepam 1 mg po Q 6 hrs prn for CIWA >10.   Insomnia. Continue Trazodone 50 mg po prn Q hs.  Other medical issues: Continue,  Farxiga 5 mg po daily for DM. Eplerenone (INSPRA) 25 mg po daily. Ferrous sulfate 325 mg po bif for anemia. Fluticasone 2 puffs bid for SOB. Continue sliding scaled insulin coverage tid with meals per blood sugar results. Lantus 15 units subq Q hs for DM. Synthroid 150 mcg po Q am for hypothyroidism.  Metformin 1.000 mg po bid for DM.  Nicotine patch 14 mg topically Q 24 hrs for nicotine withdrawal management.  Protonix 40 mg po Q am for GERD. Crestor 40 mg po Q  1200 noon for hyperlipidemia.  Thiamine 100 mg po daily for thiamine replacement.  Continue all other prn medications as recommended. Encourage group participation. Discharge disposition plan is ongoing.  Armandina Stammer, NP, pmhnp, fnp-bc 08/05/2020, 4:04 PM

## 2020-08-05 NOTE — [facility] Group Notes (Signed)
[facility] Group Notes:  (Nursing/MHT/Case Management/Adjunct)  Date:  08/05/2020  Time:  10:37 AM  Type of Therapy:   Goals group  Participation Level:  Active  Participation Quality:  Appropriate and Attentive  Affect:  Depressed  Cognitive:  Alert and Appropriate  Insight:  Improving  Engagement in Group:  Engaged  Modes of Intervention:  Discussion and Education  Summary of Progress/Problems:  She reported no goal for today because "I feel like I have reached my goals."  She states that she is good with her medications.  Norm Parcel Janaiya Beauchesne 08/05/2020, 10:37 AM

## 2020-08-05 NOTE — Progress Notes (Signed)
[facility] Group Notes:  (Nursing/MHT/Case Management/Adjunct)  Date:  08/05/2020  Time:  2015  Type of Therapy:   wrap up group  Participation Level:  Active  Participation Quality:  Appropriate, Attentive, Sharing, and Supportive  Affect:  Appropriate  Cognitive:  Appropriate  Insight:  Improving  Engagement in Group:  Engaged  Modes of Intervention:  Clarification, Education, and Support  Summary of Progress/Problems: Positive thinking and positive change were discussed.   Marcille Buffy 08/05/2020, 10:21 PM

## 2020-08-06 LAB — GLUCOSE, CAPILLARY
Glucose-Capillary: 125 mg/dL — ABNORMAL HIGH (ref 70–99)
Glucose-Capillary: 68 mg/dL — ABNORMAL LOW (ref 70–99)
Glucose-Capillary: 80 mg/dL (ref 70–99)

## 2020-08-06 MED ORDER — SERTRALINE HCL 25 MG PO TABS: 25.0000 mg | ORAL_TABLET | Freq: Every day | ORAL | 0 refills | Status: DC

## 2020-08-06 MED ORDER — FERROUS SULFATE 325 (65 FE) MG PO TABS: 325.0000 mg | ORAL_TABLET | Freq: Two times a day (BID) | ORAL | 0 refills | Status: DC

## 2020-08-06 NOTE — [facility] Group Notes (Addendum)
[facility] Group Notes:  (Nursing/MHT/Case Management/Adjunct)   Date:  08/06/2020  Time:  12:58 PM   Type of Therapy:  Group Therapy   Participation Level:  Active   Participation Quality:  Appropriate, Attentive, and Sharing   Affect:  Anxious   Cognitive:  Alert and Appropriate   Insight:  Improving   Engagement in Group:  Engaged and Supportive   Modes of Intervention:  Activity and Rapport Building   Summary of Progress/Problems:  Anna Rojas was able to participate with the group activity.  She shared two true things about herself and one that is not true.  The group was able to pick what was the untrue statements.   Norm Parcel [name] 08/06/2020, 12:58 PM

## 2020-08-06 NOTE — Progress Notes (Signed)
RN met with pt and reviewed pt's discharge instructions.  Pt verbalized understanding of discharge instructions and pt did not have any questions. RN reviewed and provided pt with a copy of SRA, AVS and Transition Record.  RN returned pt's belongings to pt.   Pt denied SI/HI/AVH and voiced no concerns.  Pt was appreciative of the care pt received at [facility].  Patient discharged to the lobby without incident. Pt's boyfriend was waiting in the lobby.

## 2020-08-06 NOTE — Progress Notes (Signed)
Recreation Therapy Notes  Date:  6.17.22 Time: 0930 Location: [address] Day[address]  Group Topic: Stress Management  Goal Area(s) Addresses:  Patient will identify positive stress management techniques. Patient will identify benefits of using stress management post d/c.  Behavioral Response: Attentive  Intervention: Stress Management  Activity :  Meditation.  LRT played on meditation that focused on looking at each day as a new opportunity to do something new and be productive.  Education:  Stress Management, Discharge Planning.   Education Outcome: Acknowledges Education  Clinical Observations/Feedback:  Pt attended and participated.  Pt had no questions and expressed no concerns.    Caroll Rancher, LRT/CTRS         Lillia Abed, Askia Hazelip A 08/06/2020 11:20 AM

## 2020-08-06 NOTE — Progress Notes (Signed)
D Alert and Oriented Presents with organized thought process. C/O of anxiety and sleep disturbances prn vistaril 25 mg PO and trazodone 50 mg administered at 2123 and effective by 2220. Patient Denies SI/HI/A/AVH and verbally contracted for safety.   A Scheduled medications administered per Provider order. Support and encouragement provided. Routine safety checks conducted every 15 minutes. Patient notified to inform staff with problems or concerns.  R. No adverse drug reactions noted. Patient contracts for safety at this time. Will continue to monitor Patient.

## 2020-08-06 NOTE — [facility] Group Notes (Signed)
[facility] Group Notes:  (Nursing/MHT/Case Management/Adjunct)  Date:  08/06/2020  Time:  11:40 AM  Type of Therapy:   Goals group  Participation Level:  Active  Participation Quality:  Appropriate, Attentive, and Sharing  Affect:  Appropriate  Cognitive:  Alert and Appropriate  Insight:  Appropriate  Engagement in Group:  Engaged and Supportive  Modes of Intervention:  Discussion and Education  Summary of Progress/Problems:  Shirl reports her goal for today is "stay positive and work on accepting the things that I can't control.  She stated that she slept good last night.  Norm Parcel Nashid Pellum 08/06/2020, 11:40 AM

## 2020-08-06 NOTE — Progress Notes (Signed)
  [facility] Adult Case Management Discharge Plan :  Will you be returning to the same living situation after discharge:  Yes,  Home  At discharge, do you have transportation home?: Yes,  Fiance  Do you have the ability to pay for your medications: Yes,  Medicaid   Release of information consent forms completed and in the chart;  Patient's signature needed at discharge.  Patient to Follow up at:  Follow-up Information     [facility]. Go on 08/12/2020.   Why: You have a hospital follow up appointment for therapy and medication management services on  08/12/20 at 3:00 pm.  This appointment will be held in person. Contact information: [address] [place], [place] [address]  P: [phone], [phone] intake F: [phone]                Next level of care provider has access to [facility] Link:no  Safety Planning and Suicide Prevention discussed: Yes,  with Fiance and patient   Have you used any form of tobacco in the last 30 days? (Cigarettes, Smokeless Tobacco, Cigars, and/or Pipes): Yes  Has patient been referred to the Quitline?: Patient refused referral  Patient has been referred for addiction treatment: N/A  Aram Beecham, LCSWA 08/06/2020, 9:38 AM

## 2020-08-06 NOTE — [facility] Suicide Risk Assessment (Signed)
[facility] Discharge Suicide Risk Assessment   Principal Problem: MDD (major depressive disorder), recurrent severe, without psychosis (HCC) Discharge Diagnoses: Principal Problem:   MDD (major depressive disorder), recurrent severe, without psychosis (HCC)   Total Time spent with patient: 15 minutes  Musculoskeletal: Strength & Muscle Tone: within normal limits Gait & Station: normal Patient leans: N/A  Psychiatric Specialty Exam  Presentation  General Appearance: Fairly Groomed  Eye Contact:Good  Speech:Normal Rate  Speech Volume:Normal  Handedness:Right   Mood and Affect  Mood:Euthymic  Duration of Depression Symptoms: Greater than two weeks  Affect:Congruent   Thought Process  Thought Processes:Coherent  Descriptions of Associations:Intact  Orientation:Full (Time, Place and Person)  Thought Content:Logical  History of Schizophrenia/Schizoaffective disorder:No  Duration of Psychotic Symptoms:No data recorded Hallucinations:Hallucinations: None  Ideas of Reference:None  Suicidal Thoughts:Suicidal Thoughts: No  Homicidal Thoughts:Homicidal Thoughts: No   Sensorium  Memory:Immediate Good; Recent Good; Remote Good  Judgment:Intact  Insight:Good   Executive Functions  Concentration:Good  Attention Span:Good  Recall:Fair  Fund of Knowledge:Fair  Language:Good   Psychomotor Activity  Psychomotor Activity:Psychomotor Activity: Normal   Assets  Assets:Desire for Improvement; Resilience; Housing   Sleep  Sleep:Sleep: Good Number of Hours of Sleep: 6.25   Physical Exam: Physical Exam Vitals and nursing note reviewed.  Constitutional:      Appearance: Normal appearance.  HENT:     Head: Normocephalic and atraumatic.  Pulmonary:     Effort: Pulmonary effort is normal.  Neurological:     General: No focal deficit present.     Mental Status: She is alert and oriented to person, place, and time.   Review of Systems  Constitutional:   Positive for malaise/fatigue.  Musculoskeletal:  Positive for joint pain and myalgias.  All other systems reviewed and are negative. Blood pressure 114/65, pulse 87, temperature 98 F (36.7 C), temperature source Oral, resp. rate 16, height 5' 1.75" (1.568 m), weight 91.6 kg, SpO2 94 %. Body mass index is 37.25 kg/m.  Mental Status Per Nursing Assessment::   On Admission:  Self-harm thoughts  Demographic Factors:  Divorced or widowed, Caucasian, Low socioeconomic status, Living alone, and Unemployed  Loss Factors: Loss of significant relationship  Historical Factors: Impulsivity  Risk Reduction Factors:   Positive social support  Continued Clinical Symptoms:  Depression:   Comorbid alcohol abuse/dependence Impulsivity Alcohol/Substance Abuse/Dependencies  Cognitive Features That Contribute To Risk:  None    Suicide Risk:  Minimal: No identifiable suicidal ideation.  Patients presenting with no risk factors but with morbid ruminations; may be classified as minimal risk based on the severity of the depressive symptoms   Follow-up Information     [facility]. Go on 08/12/2020.   Why: You have a hospital follow up appointment for therapy and medication management services on  08/12/20 at 3:00 pm.  This appointment will be held in person. Contact information: [address] [place], [place] [address]  P: [phone], [phone] intake F: [phone]                Plan Of Care/Follow-up recommendations:  Activity:  ad lib  Antonieta Pert, MD 08/06/2020, 8:23 AM

## 2020-08-06 NOTE — [facility] Suicide Risk Assessment (Signed)
[facility] INPATIENT:  Family/Significant Other Suicide Prevention Education  Suicide Prevention Education:  Education Completed; Eugenia Mcalpine [phone] [facility]) has been identified by the patient as the family member/significant other with whom the patient will be residing, and identified as the person(s) who will aid the patient in the event of a mental health crisis (suicidal ideations/suicide attempt).  With written consent from the patient, the family member/significant other has been provided the following suicide prevention education, prior to the and/or following the discharge of the patient.  The suicide prevention education provided includes the following: Suicide risk factors Suicide prevention and interventions National Suicide Hotline telephone number [facility] assessment telephone number [facility] Emergency Assistance 911 [facility] and/or Residential Mobile Crisis Unit telephone number  Request made of family/significant other to: Remove weapons (e.g., guns, rifles, knives), all items previously/currently identified as safety concern.   Remove drugs/medications (over-the-counter, prescriptions, illicit drugs), all items previously/currently identified as a safety concern.  The family member/significant other verbalizes understanding of the suicide prevention education information provided.  The family member/significant other agrees to remove the items of safety concern listed above.  CSW spoke with Mr. Thomasena Edis who states that his Fiance was sober for a few years before relapsing after the death of her friend.  He states that since being in the hospital she is sounds much better and he believes this will help her in the future as well.  He states that his Fiance has a good support system at home.  Mr. Thomasena Edis states that there are no firearms or weapons in the home.  CSW completed SPE with Mr. Thomasena Edis.   Metro Kung Lawana Hartzell 08/06/2020, 9:32 AM

## 2020-08-06 NOTE — Discharge Summary (Signed)
Physician Discharge Summary Note  Patient:  Anna Rojas is an 65 y.o., female MRN:  [id number] DOB:  February 27, 1955 Patient phone:  [phone] (home)  Patient address:   [address] [place] [address],  Total Time spent with patient:  Greater than 30 minutes  Date of Admission:  08/02/2020  Date of Discharge: 08-06-20  Reason for Admission: Worsening suicidal ideations triggered by recent death of a friend.  Principal Problem: MDD (major depressive disorder), recurrent severe, without psychosis (HCC)  Discharge Diagnoses: Principal Problem:   MDD (major depressive disorder), recurrent severe, without psychosis (HCC)  Past Psychiatric History: MDD  Past Medical History:  Past Medical History:  Diagnosis Date   AICD (automatic cardioverter/defibrillator) present    CHF (congestive heart failure) (HCC)    COPD (chronic obstructive pulmonary disease) (HCC)    Diabetes mellitus without complication (HCC)    Hypothyroidism     Past Surgical History:  Procedure Laterality Date   CARDIAC DEFIBRILLATOR PLACEMENT     PACEMAKER PLACEMENT     Family History:  Family History  Problem Relation Age of Onset   Brain cancer Mother    Lung cancer Sister    Family Psychiatric  History: See H&P Social History:  Social History   Substance and Sexual Activity  Alcohol Use Yes     Social History   Substance and Sexual Activity  Drug Use Not Currently    Social History   Socioeconomic History   Marital status: Divorced    Spouse name: Not on file   Number of children: Not on file   Years of education: Not on file   Highest education level: Not on file  Occupational History   Not on file  Tobacco Use   Smoking status: Every Day    Packs/day: 0.50    Years: 3.00    Pack years: 1.50    Types: Cigarettes   Smokeless tobacco: Never  Vaping Use   Vaping Use: Never used  Substance and Sexual Activity   Alcohol use: Yes   Drug use: Not Currently   Sexual  activity: Yes    Birth control/protection: Condom  Other Topics Concern   Not on file  Social History Narrative   Not on file   Social Determinants of Health   Financial Resource Strain: Not on file  Food Insecurity: Not on file  Transportation Needs: Not on file  Physical Activity: Not on file  Stress: Not on file  Social Connections: Not on file   Hospital Course: (Per Md's admission evaluation): Patient is a 65 year old female admitted secondary to suicidal ideation. Histo Patient is a 65 year old female with a past psychiatric history significant for alcohol dependence and depression who presented to the [facility] emergency department on 08/03/2020 with suicidal ideation.  The patient stated that her good friend had recently died in hospice, and then went to visit her, and triggered emotions from the death of her sister in the past.  The patient admitted that she had relapsed on alcohol and was drinking approximately 18 beers a day.  She admitted to helplessness, hopelessness, worthlessness and thoughts of suicide.   Prior to this discharge, [name] was seen & evaluated for mental health stability. The current laboratory findings were reviewed (stable), nurses notes & vital signs were reviewed as well. There are no current mental health or medical issues that should prevent this discharge at this time. Patient is being discharged to continue mental health care as noted below.   This is the  first psychiatric admission/discharge summary for this 65 year old Caucasian female. She was admitted to the [facility] with complaint of worsening suicidal ideations triggered by the recent death of her friend. Apparently per chart review, patient lost her dear friend who was under hospice care prior to her death. After her friend's death, patient started feeling guilty because she was not able to visit her friend while undergoing hospice care. She says her guilt trip led her to relapse on alcohol which   to her feeling suicidal. She was brought to the hospital for evaluation & treatment.    After evaluation of her presenting symptoms, it was jointly agreed by the treatment team to recommend [facility] for detoxification/mood stabilization treatments. The medication regimen targeting those presenting symptoms were discussed & initiated with her consent. She received a brief detoxification treatments for substance withdrawal management on a prn basis as she was not showing active withdrawal symptoms on her arrival to the [facility] & throughout her hospitalization. She was however, treated, stabilized & discharged on the medications as listed below on her discharge medication lists. [name] was also enrolled & participated in the group counseling sessions being offered & held on this unit. She learned coping skills. She presented other significant pre-existing medical issues that required treatment  & or monitoring. Sh was treated & discharged on all the medications for those health issues. She tolerated her treatment regimen without any adverse effects or reactions reported.    [name]'s symptoms responded well to her treatment regimen warranting this discharge. This is evidenced by her reports of improved mood, resolution of symptoms, presentation of good affect/eye contact, readiness for discharge & absence of substance withdrawal symptoms. She presents currently mentally & medically stable for discharge to continue mental health care on an outpatient basis as recommended below.    Today upon her discharge evaluation with the attending psychiatrist, Kirk shares, "I'm doing good. I feel much better, no more guilt trip". She denies any specific concerns. She is sleeping well. Her appetite is good. She denies any other physical complaints. She denies SI/HI/AH/VH, delusional thoughts or paranoia. She is tolerating her medications well & in agreement to continue her current regimen as recommended. She will follow up for  routine psychiatric care, further substance abuse treatment & medication management as noted below. She is provided with all the necessary information needed to make these appointments without problems. She was able to engage in safety planning including plan to return to [facility] or contact emergency services if she feels unable to maintain her own safety or the safety of others. Pt had no further questions, comments or concerns. She left [facility] with all personal belongings in no apparent distress. Transportation per her fiancee.   Physical Findings: AIMS:  , ,  ,  ,    CIWA:  CIWA-Ar Total: 2 COWS:     Musculoskeletal: Strength & Muscle Tone: within normal limits Gait & Station: normal Patient leans: N/A  Psychiatric Specialty Exam:  Presentation  General Appearance: Casual  Eye Contact:Good  Speech:Normal Rate  Speech Volume:Normal  Handedness:Right  Mood and Affect  Mood:Euthymic  Affect:Appropriate  Thought Process  Thought Processes:Coherent  Descriptions of Associations:Intact  Orientation:Full (Time, Place and Person)  Thought Content:Logical  History of Schizophrenia/Schizoaffective disorder:No  Duration of Psychotic Symptoms:No data recorded Hallucinations:Hallucinations: None  Ideas of Reference:None  Suicidal Thoughts:Suicidal Thoughts: No  Homicidal Thoughts:Homicidal Thoughts: No  Sensorium  Memory:Immediate Good; Recent Good; Remote Good  Judgment:Impaired  Insight:Good  Executive Functions  Concentration:Good  Attention  Span:Good  Recall:Good  Fund of Knowledge:Good  Language:Good   Psychomotor Activity  Psychomotor Activity:Psychomotor Activity: Normal  Assets  Assets:Desire for Improvement; Resilience; Housing  Sleep  Sleep:Sleep: Good Number of Hours of Sleep: 6.25  Physical Exam: Physical Exam Vitals and nursing note reviewed.  HENT:     Head: Normocephalic.     Nose: Nose normal.     Mouth/Throat:     Pharynx:  Oropharynx is clear.  Eyes:     Pupils: Pupils are equal, round, and reactive to light.  Cardiovascular:     Rate and Rhythm: Normal rate.     Pulses: Normal pulses.  Pulmonary:     Effort: Pulmonary effort is normal.  Genitourinary:    Comments: Deferred Musculoskeletal:        General: Normal range of motion.     Cervical back: Normal range of motion.  Skin:    General: Skin is warm and dry.  Neurological:     General: No focal deficit present.     Mental Status: She is alert and oriented to person, place, and time. Mental status is at baseline.   Review of Systems  Constitutional: Negative.   HENT: Negative.    Eyes: Negative.   Respiratory: Negative.    Cardiovascular: Negative.   Gastrointestinal: Negative.   Genitourinary: Negative.   Musculoskeletal: Negative.   Skin: Negative.   Neurological: Negative.   Endo/Heme/Allergies:        Allergies: See lists (multiple).  Psychiatric/Behavioral:  Positive for depression (Hx of. (stable on medication)) and substance abuse (Hx. alcoholism (stable)). Negative for hallucinations, memory loss and suicidal ideas. The patient has insomnia (Hx of( stable on medication).). The patient is not nervous/anxious (Stable upon discharge).   Blood pressure 114/65, pulse 87, temperature 98 F (36.7 C), temperature source Oral, resp. rate 16, height 5' 1.75" (1.568 m), weight 91.6 kg, SpO2 94 %. Body mass index is 37.25 kg/m.   Social History   Tobacco Use  Smoking Status Every Day   Packs/day: 0.50   Years: 3.00   Pack years: 1.50   Types: Cigarettes  Smokeless Tobacco Never   Tobacco Cessation:  N/A, patient does not currently use tobacco products  Blood Alcohol level:  Lab Results  Component Value Date   ETH <10 11/21/2018    Metabolic Disorder Labs:  Lab Results  Component Value Date   HGBA1C 6.6 (H) 08/03/2020   MPG 143 08/03/2020   MPG 271.87 11/22/2018   No results found for: PROLACTIN Lab Results  Component  Value Date   CHOL 150 11/22/2018   TRIG 128 11/22/2018   HDL 39 (L) 11/22/2018   CHOLHDL 3.8 11/22/2018   VLDL 26 11/22/2018   LDLCALC 85 11/22/2018    See Psychiatric Specialty Exam and Suicide Risk Assessment completed by Attending Physician prior to discharge.  Discharge destination:  Home  Is patient on multiple antipsychotic therapies at discharge:  No   Has Patient had three or more failed trials of antipsychotic monotherapy by history:  No  Recommended Plan for Multiple Antipsychotic Therapies: NA  Discharge Instructions     Diet - low sodium heart healthy   Complete by: As directed    Increase activity slowly   Complete by: As directed       Allergies as of 08/06/2020       Reactions   Beclomethasone Itching, Rash, Other (See Comments)   Flushing skin redness, also   Brimonidine Hives, Itching   Codeine Hives, Rash  Darvon [propoxyphene] Hives   Iodinated Diagnostic Agents Hives   Iodine-131 Hives   Oxycodone Hives, Rash   Tranexamic Acid Itching, Rash   Ceftriaxone Sodium In Dextrose Rash   12/29/17 Developed diffuse pruritic rash on chest, face, hips, arms after finishing rocephin dose.  Patient did have rocephin in 2017 without a problem though... 11/22/2018 - unable to tolerate retrial of ceftriaxone, rash developed after dose.    Norethindrone Rash   Brimonidine Tartrate Hives, Itching   Celecoxib Itching, Rash   Oxycontin [oxycodone Hcl] Hives, Rash        Medication List     STOP taking these medications    citalopram 20 MG tablet Commonly known as: CELEXA   lactulose 10 GM/15ML solution Commonly known as: CHRONULAC   spironolactone 25 MG tablet Commonly known as: ALDACTONE   Voltaren 1 % Gel Generic drug: diclofenac Sodium       TAKE these medications      Indication  aspirin EC 81 MG tablet Take 81 mg by mouth daily.  Indication: Stable Angina Pectoris   eplerenone 25 MG tablet Commonly known as: INSPRA Take 25 mg by mouth  daily.  Indication: Left Systolic Heart Failure   Farxiga 5 MG Tabs tablet Generic drug: dapagliflozin propanediol Take 5 mg by mouth daily.  Indication: Type 2 Diabetes   ferrous sulfate 325 (65 FE) MG tablet Take 1 tablet (325 mg total) by mouth 2 (two) times daily with a meal.  Indication: Anemia From Inadequate Iron in the Body   metFORMIN 500 MG tablet Commonly known as: GLUCOPHAGE Take 500 mg by mouth 2 (two) times daily with a meal.  Indication: Type 2 Diabetes   montelukast 10 MG tablet Commonly known as: SINGULAIR Take 1 tablet by mouth daily.  Indication: Asthma   omeprazole 40 MG capsule Commonly known as: PRILOSEC Take 40 mg by mouth daily.  Indication: Gastroesophageal Reflux Disease   rosuvastatin 20 MG tablet Commonly known as: CRESTOR Take 20 mg by mouth daily.  Indication: High Amount of Fats in the Blood   sertraline 25 MG tablet Commonly known as: ZOLOFT Take 1 tablet (25 mg total) by mouth daily. Start taking on: August 07, 2020  Indication: Major Depressive Disorder   Symbicort 160-4.5 MCG/ACT inhaler Generic drug: budesonide-formoterol Inhale 2 puffs into the lungs 2 (two) times daily.  Indication: Chronic Obstructive Lung Disease   Synthroid 150 MCG tablet Generic drug: levothyroxine Take 150 mcg by mouth daily.  Indication: Underactive Thyroid   Tresiba FlexTouch 100 UNIT/ML FlexTouch Pen Generic drug: insulin degludec Inject 16 Units into the skin at bedtime.  Indication: Type 2 Diabetes        Follow-up Information     [facility].   Why: You have a hospital follow up appointment for therapy and medication management services on  08/12/20 at 3:00 pm.  This appointment will be held in person. Contact information: [address] [place], [place] [address]  P: [phone], [phone] intake F: [phone]               Follow-up recommendations: Activity:  As tolerated Diet: As  recommended by your primary care doctor. Keep all scheduled follow-up appointments as recommended.    Comments: Prescriptions given at discharge.  Patient agreeable to plan.  Given opportunity to ask questions.  Appears to feel comfortable with discharge denies any current suicidal or homicidal thought. Patient is also instructed prior to discharge to: Take all  medications as prescribed by his/her mental healthcare provider. Report any adverse effects and or reactions from the medicines to his/her outpatient provider promptly. Patient has been instructed & cautioned: To not engage in alcohol and or illegal drug use while on prescription medicines. In the event of worsening symptoms, patient is instructed to call the crisis hotline, 911 and or go to the nearest ED for appropriate evaluation and treatment of symptoms. To follow-up with his/her primary care provider for your other medical issues, concerns and or health care needs.   Signed: Armandina Stammer, NP, PMHNP, FNP-BC 08/06/2020, 10:18 AM

## 2020-08-08 ENCOUNTER — Other Ambulatory Visit (HOSPITAL_COMMUNITY): Payer: Self-pay | Admitting: Psychiatry
